# Patient Record
Sex: Male | Born: 1950 | Race: White | Hispanic: No | Marital: Married | Smoking: Former smoker
Health system: Southern US, Community
[De-identification: ages and names within clinical notes are randomized; demographics above are authoritative.]

## PROBLEM LIST (undated history)

## (undated) DIAGNOSIS — K551 Chronic vascular disorders of intestine: Secondary | ICD-10-CM

## (undated) DIAGNOSIS — I2699 Other pulmonary embolism without acute cor pulmonale: Secondary | ICD-10-CM

## (undated) DIAGNOSIS — J309 Allergic rhinitis, unspecified: Secondary | ICD-10-CM

## (undated) DIAGNOSIS — L659 Nonscarring hair loss, unspecified: Secondary | ICD-10-CM

## (undated) DIAGNOSIS — S0590XA Unspecified injury of unspecified eye and orbit, initial encounter: Secondary | ICD-10-CM

## (undated) DIAGNOSIS — F329 Major depressive disorder, single episode, unspecified: Secondary | ICD-10-CM

## (undated) DIAGNOSIS — I2510 Atherosclerotic heart disease of native coronary artery without angina pectoris: Secondary | ICD-10-CM

## (undated) DIAGNOSIS — N529 Male erectile dysfunction, unspecified: Secondary | ICD-10-CM

## (undated) DIAGNOSIS — N400 Enlarged prostate without lower urinary tract symptoms: Secondary | ICD-10-CM

## (undated) DIAGNOSIS — M545 Low back pain, unspecified: Secondary | ICD-10-CM

## (undated) DIAGNOSIS — G8929 Other chronic pain: Secondary | ICD-10-CM

## (undated) DIAGNOSIS — D126 Benign neoplasm of colon, unspecified: Secondary | ICD-10-CM

## (undated) DIAGNOSIS — R7301 Impaired fasting glucose: Secondary | ICD-10-CM

## (undated) DIAGNOSIS — K219 Gastro-esophageal reflux disease without esophagitis: Secondary | ICD-10-CM

## (undated) DIAGNOSIS — F32A Depression, unspecified: Secondary | ICD-10-CM

## (undated) DIAGNOSIS — I10 Essential (primary) hypertension: Secondary | ICD-10-CM

## (undated) DIAGNOSIS — Z7901 Long term (current) use of anticoagulants: Secondary | ICD-10-CM

## (undated) DIAGNOSIS — A6000 Herpesviral infection of urogenital system, unspecified: Secondary | ICD-10-CM

## (undated) HISTORY — PX: COLONOSCOPY: SHX174

## (undated) HISTORY — PX: HEMORRHOID SURGERY: SHX153

---
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
## 2005-10-31 ENCOUNTER — Ambulatory Visit: Payer: Self-pay | Admitting: Unknown Physician Specialty

## 2008-12-21 ENCOUNTER — Ambulatory Visit: Payer: Self-pay | Admitting: Unknown Physician Specialty

## 2013-09-12 ENCOUNTER — Ambulatory Visit: Payer: Self-pay | Admitting: Internal Medicine

## 2013-09-20 ENCOUNTER — Other Ambulatory Visit: Payer: Self-pay

## 2013-09-20 ENCOUNTER — Inpatient Hospital Stay: Payer: Self-pay | Admitting: Internal Medicine

## 2013-09-20 ENCOUNTER — Ambulatory Visit: Payer: Self-pay

## 2013-09-21 LAB — CBC WITH DIFFERENTIAL/PLATELET
Basophil #: 0 10*3/uL (ref 0.0–0.1)
Basophil %: 0.2 %
Eosinophil #: 0.2 10*3/uL (ref 0.0–0.7)
Eosinophil %: 2 %
HCT: 38.2 % — ABNORMAL LOW (ref 40.0–52.0)
HGB: 13.1 g/dL (ref 13.0–18.0)
LYMPHS ABS: 1.7 10*3/uL (ref 1.0–3.6)
Lymphocyte %: 18.7 %
MCH: 31.6 pg (ref 26.0–34.0)
MCHC: 34.3 g/dL (ref 32.0–36.0)
MCV: 92 fL (ref 80–100)
Monocyte #: 1.1 x10 3/mm — ABNORMAL HIGH (ref 0.2–1.0)
Monocyte %: 11.4 %
NEUTROS PCT: 67.7 %
Neutrophil #: 6.3 10*3/uL (ref 1.4–6.5)
Platelet: 187 10*3/uL (ref 150–440)
RBC: 4.15 10*6/uL — AB (ref 4.40–5.90)
RDW: 12 % (ref 11.5–14.5)
WBC: 9.2 10*3/uL (ref 3.8–10.6)

## 2013-09-21 LAB — COMPREHENSIVE METABOLIC PANEL
ALBUMIN: 3.3 g/dL — AB (ref 3.4–5.0)
ALK PHOS: 67 U/L
ALT: 30 U/L (ref 12–78)
ANION GAP: 2 — AB (ref 7–16)
BUN: 11 mg/dL (ref 7–18)
Bilirubin,Total: 0.6 mg/dL (ref 0.2–1.0)
CALCIUM: 8.6 mg/dL (ref 8.5–10.1)
CHLORIDE: 103 mmol/L (ref 98–107)
Co2: 30 mmol/L (ref 21–32)
Creatinine: 0.87 mg/dL (ref 0.60–1.30)
Glucose: 108 mg/dL — ABNORMAL HIGH (ref 65–99)
Osmolality: 270 (ref 275–301)
POTASSIUM: 4 mmol/L (ref 3.5–5.1)
SGOT(AST): 23 U/L (ref 15–37)
Sodium: 135 mmol/L — ABNORMAL LOW (ref 136–145)
TOTAL PROTEIN: 6.9 g/dL (ref 6.4–8.2)

## 2013-09-21 LAB — APTT: Activated PTT: 68.9 secs — ABNORMAL HIGH (ref 23.6–35.9)

## 2013-09-22 LAB — BASIC METABOLIC PANEL
ANION GAP: 6 — AB (ref 7–16)
BUN: 11 mg/dL (ref 7–18)
CALCIUM: 8.9 mg/dL (ref 8.5–10.1)
CHLORIDE: 100 mmol/L (ref 98–107)
Co2: 30 mmol/L (ref 21–32)
Creatinine: 0.92 mg/dL (ref 0.60–1.30)
EGFR (African American): >60
EGFR (Non-African Amer.): >60
Glucose: 99 mg/dL (ref 65–99)
Osmolality: 271 (ref 275–301)
Potassium: 4.3 mmol/L (ref 3.5–5.1)
Sodium: 136 mmol/L (ref 136–145)

## 2013-09-22 LAB — IRON AND TIBC
IRON BIND. CAP.(TOTAL): 266 ug/dL (ref 250–450)
IRON SATURATION: 19 %
IRON: 50 ug/dL — AB (ref 65–175)
Unbound Iron-Bind.Cap.: 216 ug/dL

## 2013-09-22 LAB — CBC WITH DIFFERENTIAL/PLATELET
BASOS ABS: 0 10*3/uL (ref 0.0–0.1)
BASOS PCT: 0.3 %
EOS PCT: 1.5 %
Eosinophil #: 0.1 10*3/uL (ref 0.0–0.7)
HCT: 38.4 % — ABNORMAL LOW (ref 40.0–52.0)
HGB: 13.2 g/dL (ref 13.0–18.0)
LYMPHS PCT: 17.4 %
Lymphocyte #: 1.4 10*3/uL (ref 1.0–3.6)
MCH: 31.4 pg (ref 26.0–34.0)
MCHC: 34.4 g/dL (ref 32.0–36.0)
MCV: 91 fL (ref 80–100)
MONOS PCT: 12.3 %
Monocyte #: 1 x10 3/mm (ref 0.2–1.0)
Neutrophil #: 5.5 10*3/uL (ref 1.4–6.5)
Neutrophil %: 68.5 %
Platelet: 206 10*3/uL (ref 150–440)
RBC: 4.21 10*6/uL — AB (ref 4.40–5.90)
RDW: 12 % (ref 11.5–14.5)
WBC: 8.1 10*3/uL (ref 3.8–10.6)

## 2013-09-22 LAB — APTT
ACTIVATED PTT: 48.4 s — AB (ref 23.6–35.9)
Activated PTT: 99 secs — ABNORMAL HIGH (ref 23.6–35.9)

## 2013-09-22 LAB — PROTIME-INR
INR: 1.9
PROTHROMBIN TIME: 21.5 s — AB (ref 11.5–14.7)

## 2013-09-22 LAB — FERRITIN: Ferritin (ARMC): 311 ng/mL (ref 8–388)

## 2013-09-23 LAB — APTT
ACTIVATED PTT: 125 s — AB (ref 23.6–35.9)
Activated PTT: 78.4 secs — ABNORMAL HIGH (ref 23.6–35.9)

## 2013-09-23 LAB — CBC WITH DIFFERENTIAL/PLATELET
BASOS PCT: 0.4 %
Basophil #: 0 10*3/uL (ref 0.0–0.1)
Eosinophil #: 0.2 10*3/uL (ref 0.0–0.7)
Eosinophil %: 2.8 %
HCT: 37.1 % — ABNORMAL LOW (ref 40.0–52.0)
HGB: 13 g/dL (ref 13.0–18.0)
LYMPHS PCT: 24.8 %
Lymphocyte #: 2.1 10*3/uL (ref 1.0–3.6)
MCH: 31.9 pg (ref 26.0–34.0)
MCHC: 35.1 g/dL (ref 32.0–36.0)
MCV: 91 fL (ref 80–100)
MONO ABS: 1.1 x10 3/mm — AB (ref 0.2–1.0)
MONOS PCT: 12.6 %
Neutrophil #: 5.1 10*3/uL (ref 1.4–6.5)
Neutrophil %: 59.4 %
Platelet: 218 10*3/uL (ref 150–440)
RBC: 4.08 10*6/uL — ABNORMAL LOW (ref 4.40–5.90)
RDW: 11.8 % (ref 11.5–14.5)
WBC: 8.6 10*3/uL (ref 3.8–10.6)

## 2013-09-23 LAB — CEA: CEA: 0.8 ng/mL (ref 0.0–4.7)

## 2013-09-23 LAB — PROTIME-INR
INR: 1.4
PROTHROMBIN TIME: 16.8 s — AB (ref 11.5–14.7)

## 2013-09-23 LAB — PSA: PSA: 0.3 ng/mL (ref 0.0–4.0)

## 2013-10-18 ENCOUNTER — Ambulatory Visit: Payer: Self-pay | Admitting: Internal Medicine

## 2013-11-10 ENCOUNTER — Ambulatory Visit: Payer: Self-pay | Admitting: Internal Medicine

## 2013-11-16 LAB — CANCER ANTIGEN 19-9: CA 19-9: 9 U/mL (ref 0–35)

## 2013-12-10 ENCOUNTER — Ambulatory Visit: Payer: Self-pay | Admitting: Internal Medicine

## 2013-12-27 ENCOUNTER — Ambulatory Visit: Payer: Self-pay | Admitting: Family Medicine

## 2014-01-10 ENCOUNTER — Ambulatory Visit: Payer: Self-pay | Admitting: Internal Medicine

## 2014-01-10 LAB — CBC CANCER CENTER
BASOS ABS: 0.1 x10 3/mm (ref 0.0–0.1)
Basophil %: 1 %
EOS ABS: 0.1 x10 3/mm (ref 0.0–0.7)
EOS PCT: 1.8 %
HCT: 43.3 % (ref 40.0–52.0)
HGB: 14.6 g/dL (ref 13.0–18.0)
LYMPHS ABS: 1.7 x10 3/mm (ref 1.0–3.6)
Lymphocyte %: 24 %
MCH: 30.1 pg (ref 26.0–34.0)
MCHC: 33.8 g/dL (ref 32.0–36.0)
MCV: 89 fL (ref 80–100)
Monocyte #: 0.6 x10 3/mm (ref 0.2–1.0)
Monocyte %: 8.5 %
NEUTROS ABS: 4.6 x10 3/mm (ref 1.4–6.5)
Neutrophil %: 64.7 %
PLATELETS: 238 x10 3/mm (ref 150–440)
RBC: 4.86 10*6/uL (ref 4.40–5.90)
RDW: 12.5 % (ref 11.5–14.5)
WBC: 7.1 x10 3/mm (ref 3.8–10.6)

## 2014-01-10 LAB — IRON AND TIBC
IRON SATURATION: 32 %
Iron Bind.Cap.(Total): 307 ug/dL (ref 250–450)
Iron: 98 ug/dL (ref 65–175)
Unbound Iron-Bind.Cap.: 209 ug/dL

## 2014-01-10 LAB — PROTIME-INR
INR: 1.8
Prothrombin Time: 20.4 secs — ABNORMAL HIGH (ref 11.5–14.7)

## 2014-01-10 LAB — FERRITIN: FERRITIN (ARMC): 163 ng/mL (ref 8–388)

## 2014-01-14 LAB — PROTIME-INR
INR: 1.9
PROTHROMBIN TIME: 21.1 s — AB (ref 11.5–14.7)

## 2014-01-19 LAB — PROTIME-INR
INR: 2
Prothrombin Time: 22.1 secs — ABNORMAL HIGH (ref 11.5–14.7)

## 2014-01-24 LAB — PROTIME-INR
INR: 1.8
PROTHROMBIN TIME: 20.4 s — AB (ref 11.5–14.7)

## 2014-01-28 LAB — PROTIME-INR
INR: 2.2
PROTHROMBIN TIME: 23.8 s — AB (ref 11.5–14.7)

## 2014-02-09 ENCOUNTER — Ambulatory Visit: Payer: Self-pay | Admitting: Internal Medicine

## 2014-02-09 LAB — PROTIME-INR
INR: 2.9
Prothrombin Time: 29.2 secs — ABNORMAL HIGH (ref 11.5–14.7)

## 2014-02-14 LAB — PROTIME-INR
INR: 2
PROTHROMBIN TIME: 22 s — AB (ref 11.5–14.7)

## 2014-02-14 LAB — D-DIMER(ARMC): D-Dimer: 295 ng/ml

## 2014-02-28 LAB — PROTIME-INR
INR: 2.5
PROTHROMBIN TIME: 26.6 s — AB (ref 11.5–14.7)

## 2014-03-07 ENCOUNTER — Ambulatory Visit: Payer: Self-pay | Admitting: Unknown Physician Specialty

## 2014-03-09 LAB — PATHOLOGY REPORT

## 2014-03-12 ENCOUNTER — Ambulatory Visit: Payer: Self-pay | Admitting: Internal Medicine

## 2014-03-14 LAB — CBC CANCER CENTER
Basophil #: 0 x10 3/mm (ref 0.0–0.1)
Basophil %: 0.5 %
EOS ABS: 0.2 x10 3/mm (ref 0.0–0.7)
Eosinophil %: 2.6 %
HCT: 41.8 % (ref 40.0–52.0)
HGB: 14 g/dL (ref 13.0–18.0)
LYMPHS ABS: 1.9 x10 3/mm (ref 1.0–3.6)
LYMPHS PCT: 29.2 %
MCH: 30.3 pg (ref 26.0–34.0)
MCHC: 33.6 g/dL (ref 32.0–36.0)
MCV: 90 fL (ref 80–100)
MONO ABS: 0.6 x10 3/mm (ref 0.2–1.0)
Monocyte %: 9.4 %
Neutrophil #: 3.7 x10 3/mm (ref 1.4–6.5)
Neutrophil %: 58.3 %
Platelet: 243 x10 3/mm (ref 150–440)
RBC: 4.63 10*6/uL (ref 4.40–5.90)
RDW: 12.7 % (ref 11.5–14.5)
WBC: 6.4 x10 3/mm (ref 3.8–10.6)

## 2014-03-14 LAB — PROTIME-INR
INR: 1.5
Prothrombin Time: 18.2 secs — ABNORMAL HIGH (ref 11.5–14.7)

## 2014-03-18 LAB — PROTIME-INR
INR: 2.1
PROTHROMBIN TIME: 22.8 s — AB (ref 11.5–14.7)

## 2014-04-01 LAB — PROTIME-INR
INR: 2.6
Prothrombin Time: 26.8 secs — ABNORMAL HIGH (ref 11.5–14.7)

## 2014-04-12 ENCOUNTER — Ambulatory Visit: Payer: Self-pay | Admitting: Internal Medicine

## 2014-12-03 NOTE — Consult Note (Signed)
PATIENT NAME:  Jacob Davenport, DOWNARD MR#:  [id number] DATE OF BIRTH:  May 28, 1951  DATE OF CONSULTATION:  09/21/2013  REFERRING PHYSICIAN:   CONSULTING PHYSICIAN:  Simonne Come. Kyle Stansell, MD  HISTORY OF PRESENT ILLNESS:  Mr. Jacob Davenport is a 64 year old patient who was admitted on February 9th with abdominal distention, belching, bilateral PEs, chest pain with wheezing. He had CT of the chest that showed bilateral pulmonary emboli. He was started on IV heparin initially. His d-dimer was slightly elevated. 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 I saw and evaluated the patient in consultation on February 10th.  I saw him  at that time and on the admission history and physical he had PE but no lower extremity clot. He had some developing pulmonary infarct on CT. His history revealed no immobilization or injury or other precipitating event. He has a negative personal history for cancer. He has a positive personal history of polyps but colonoscopy was 4-1/2 years ago and repeat was due later this year. His family history is positive for colon cancer. Baseline chemistries were normal with a borderline anemia. There was no tumor seen on the chest or abdominopelvic CT scan.   PAST HISTORY: Positive for depression, chronic back pain, hyperlipidemia, alopecia and GERD as well as hemorrhoidectomy.   MEDICATIONS AT THE TIME OF ADMISSION: Wellbutrin 300 daily, simvastatin 20 mg daily, finasteride 5 mg daily, Lexapro 20 mg daily, Toprol 25 mg daily, amlodipine 5 mg daily, multivitamin daily, ranitidine 75 mg p.r.n.  SOCIAL HISTORY:  He had no smoking. No alcohol history.   FAMILY HISTORY: Negative and no clotting or malignancies.   REVIEW OF SYSTEMS: Additional review of systems was positive for the wheezing recently and chest pain. No shortness of breath, pleuritic quality of the chest pain. Also some abdominal discomfort and  bloating and belching. He otherwise had not had and was not having any headache or visual disturbances. No dizziness. No fever, chills or sweats. He had a cough that was nonproductive. He did not have abdominal pain when I saw him. He had had some loose stools. He was short of breath on exertion, did not have any palpitations. He did not have any edema, rash, bruising, pruritus, hot or cold intolerance, recent weight loss or weight gain.   PHYSICAL EXAMINATION: GENERAL: He was alert and cooperative, in no acute distress.  EYES: Sclerae clear.  MOUTH: No thrush.  HEART: Regular.  ABDOMEN: Benign. There was slight distention. His abdomen was soft, not tender. No palpable mass or organomegaly.  LYMPH NODES: Negative in the neck, supraclavicular, submandibular, axilla.  EXTREMITIES: Negative for edema.  SKIN: Negative for rash.  NEUROLOGIC: Grossly nonfocal.  PSYCH:  Normal affect, mood was normal.  LUNGS: He had no wheezing.   LABORATORY, DIAGNOSTIC AND RADIOLOGICAL DATA: On February 10th, the bilirubin and liver functions were normal. His creatinine was 0.7. Available later was a borderline iron saturation of 19%. Ferritin was 311, that was seen from February 11th. On admission, his white count was 9.2, hemoglobin 13.1, hematocrit 38, platelets 187. Doppler of the lower extremities was negative except for a Baker's cyst of the left popliteal fossa. CT of the abdomen was as above, some mild hemorrhage in the left lower lobe of the lung, no abdominal or pelvic pathology. There was some narrowing of the SMA. Visualized skeleton as well as the liver, gallbladder, pancreas, spleen and adrenals were unremarkable. Very  small lesions, likely cysts, numerous in the kidneys and 1 clearly simple cyst in the upper pole of the right kidney. CT of the chest again as noted revealed bilateral pulmonary emboli and a left pleural effusion.   IMPRESSION: The patient is with an unprecipitated clot with no malignancy  history. He had a borderline anemia that turned out to be mildly iron deficient. 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 The PSA was 0.3, his CEA was 0.8.   PLAN:  The plan was to treat the patient with initially heparin and then he was changed to Xarelto. Follow up in the [place] to determine if the lupus inhibitor is transient or persistent. To  check an ANA and look at other coag studies in determining the length of anticoagulation. Vascular surgery has already seen and given clearance for the findings on the SMA. With the patient's history of polyps, a colonoscopy due later this year and the finding of borderline deficiency, will go on and recommend that after he has been on anticoagulation indefinite period but approximately 8 weeks, could then be okay to do briefly come off and on again for the purpose of going ahead with a followup colonoscopy. Follow up in the [place] after discharge.     ____________________________ Simonne Come. Inez Pilgrim, MD rgg:cs D: 10/24/2013 17:05:01 ET T: 10/24/2013 21:05:01 ET JOB#: [id number]  cc: Simonne Come. Inez Pilgrim, MD, <Dictator> Dallas Schimke MD ELECTRONICALLY SIGNED 10/26/2013 15:01

## 2014-12-03 NOTE — Consult Note (Signed)
Brief Consult Note: Diagnosis: PE.   Patient was seen by consultant.   Orders entered.   Comments: SEE DICTATED NOTE TO FOLLOW    PE, NO LOWER EXT CLOT, SOME PULM INFART SEEN DEVELOPING ON CT. NO PRIOR HX OR FH OF CLOT. NO IMMOBILIZATION OR INJURY OR PRECIPITATING EVENT.  POS HX POLYPS LAST COLONOSCOPY 4.5 YRS AGO. FH POS COLON CANCER.  NORMAL CHEMISTRIES, BORDERLINE ANEMIA.  NO TUMOR ON CHEST, ABDO, PLVIS SCAN.  A NARROWING OFSMA SEEN ON CT,  HAVE DISCUSSED WITH AND CONSULTED VASCULAR SURGERY.  WILL KEEP ON HEPARIN NO [place] FOR NOW. WILL CHECK  IRON STUDIES, PSA, CEA. CHECK LUPUS INHIBITOR, IF NEG , CHCK PHOSPHOLIPID ANTIBODIES AND FOR INHERITED DISORDERS ALTHOUGH LOW YIELD. IF NO REVRSIBLE INHIBITOR, THEN LOOKING AT INDEFINATE ANTICOAGULATION BASED ON RISK FACTORS MALE AND AGE.  Electronic Signatures: Dallas Schimke (MD)  (Signed 10-Feb-15 19:29)  Authored: Brief Consult Note   Last Updated: 10-Feb-15 19:29 by Dallas Schimke (MD)

## 2014-12-03 NOTE — Discharge Summary (Signed)
PATIENT NAME:  Jacob Davenport, OUTMAN MR#:  [id number] DATE OF BIRTH:  1951/08/02  DATE OF ADMISSION:  09/20/2013 DATE OF DISCHARGE:  09/23/2013  DISCHARGE DIAGNOSIS: Bilateral pulmonary emboli.  DISCHARGE MEDICATIONS: 1.  Wellbutrin 300 mg 1 tab p.o. daily.  2.  Simvastatin 20 mg p.o. at bedtime.  3.  Finasteride 5 mg p.o. daily.  4.  Multivitamin p.o. daily.  5.  Ranitidine 75 mg p.o. daily.  6.  Amlodipine 5 mg p.o. daily.  7.  Xarelto 15 mg p.o. b.i.d. x 21 days.   CONSULTANTS: Vascular surgery and hematology/oncology.   LABORATORY AND DIAGNOSTICS: CT of the chest did show bilateral PEs. CT of the abdomen showed superior mesenteric artery with stenosis. Doppler ultrasound was negative for DVTs prior to discharge.   Sodium 136, potassium 4.3, creatinine 0.92. White blood cell count 8.1, hemoglobin 13.2, platelets 206.   BRIEF HOSPITAL COURSE: Bilateral Pes: The patient initially came in with complaints of pleuritic chest pain. A CT of the chest did show bilateral PEs. He was started on heparin drip. No source of the PEs were found. Hypercoagulable work-up performed by Dr. Inez Pilgrim. Plan to continue with Xarelto indefinitely at this time. Other chronic medical issues are stable. He will follow up with Dr. Netty Starring in 1 week for those and follow up with Dr. Inez Pilgrim for the hypercoagulable workup and PEs.  ____________________________ Dion Body, MD kl:sb D: 09/23/2013 11:55:25 ET T: 09/23/2013 12:16:56 ET JOB#: [id number]  cc: Dion Body, MD, <Dictator> Dion Body MD ELECTRONICALLY SIGNED 10/01/2013 10:51

## 2014-12-03 NOTE — H&P (Signed)
PATIENT NAME:  Jacob Davenport, Jacob Davenport MR#:  [id number] DATE OF BIRTH:  Oct 30, 1950  DATE OF ADMISSION:  09/20/2013  HISTORY OF PRESENT ILLNESS: The patient is a 64 year old male presents with abdominal distention, burping, belching and bilateral PEs. He was in his normal state of health until yesterday when he was walking around the block and began having some pleuritic chest pain with wheezing. He has actually had some wheezing in the last month or so, but it got a lot worse. No productive cough or color to it. No chest tightness, just the pleuritic component to it. Bowels have been moving fine. No fever or chills. He has been overall not really active, but has been getting around. No recent surgeries. No family history of bilateral pulmonary emboli. He will be admitted for further evaluation and treatment.   PAST MEDICAL HISTORY AND MEDICAL ILLNESSES:  History of depression, chronic low back pain, hyperlipidemia, alopecia, GERD.   PAST SURGICAL HISTORY:  Hemorrhoidectomy.   MEDICATIONS: Wellbutrin XL 300 mg daily, simvastatin 20 mg at bedtime, finasteride 5 mg daily, Lexapro 20 mg daily, Toprol-XL 25 mg daily, amlodipine 5 mg daily, multivitamin daily, ranitidine 75 mg daily p.r.n.   SOCIAL HISTORY:  He is an Recruitment consultant. Married. No smoking or alcohol.   FAMILY HISTORY: Noncontributory. No thromboembolic family history.   REVIEW OF SYSTEMS: As above, otherwise negative.   PHYSICAL EXAMINATION: VITAL SIGNS: Blood pressure 130/80, pulse 85, regular; pulse oximetry 96% on room air, afebrile. Weight 194.  HEENT: Pupils are reactive.  Oropharynx benign.     LUNGS: Clear.  HEART: Regular rhythm. No audible murmur.  ABDOMEN: Good bowel sounds. Soft, mild distention. No rebound.  EXTREMITIES: Trace edema. Good peripheral pulses.  NEUROLOGIC: Grossly nonfocal.   IMAGING STUDIES:  CT angiography of the chest showed no pneumothorax. Bilateral pulmonary emboli noted in the lower  branches, as well as the right upper lobe. Some mild right heart strain with minimal left pleural effusion.   ASSESSMENT AND PLAN: 1.  Bilateral pulmonary emboli - no clear risk factors. We will check factor V.  2.  With abdominal distention, plan abdominopelvic CT scan to evaluate that, as that could be the source of the clot. No history of aneurysm.  3. Nonacute abdomen: We will treat it with IV Protonix with burping.  4. Ultimately may need ultrasound of his legs. Initiate IV heparin nomogram, ultimately converting over to p.o. Xarelto.   Labs were normal in the office. D-dimer slightly elevated at 0.7. No sign for acute coronary syndrome. Overall prognosis is guarded.     ____________________________ Rusty Aus, MD mfm:dmm D: 09/20/2013 20:16:00 ET T: 09/20/2013 20:29:47 ET JOB#: [id number]  cc: Dion Body, MD Rusty Aus, MD, <Dictator>   Rusty Aus MD ELECTRONICALLY SIGNED 09/21/2013 12:49

## 2014-12-03 NOTE — H&P (Signed)
PATIENT NAME:  Jacob Davenport, Jacob Davenport MR#:  [id number] DATE OF BIRTH:  [birth year]  DATE OF ADMISSION:  09/20/2013  A 64 year old male who presents with bilateral pulmonary emboli. He was in his usual state of health yesterday until he started ambulating, then he became short of breath with progressive pleuritic chest pain. He came to the outpatient clinic with this pleuritic chest pain.   LABORATORIES: Normal. CT scan showed bilateral PEs concomitant with this was abdominal distention and swelling with a lot of burping. No nausea, vomiting, no fever or chills. He has no family history of thromboembolic disease. He will be admitted for further evaluation and treatment.   PAST MEDICAL HISTORY AND MEDICAL ILLNESSES: Hypertension, anxiety/depression and GERD.   PAST SURGICAL HISTORY: None.   ALLERGIES: None.   MEDICATIONS: Lexapro 20 mg daily, Wellbutrin XL 3300 mg daily, Norvasc 5 mg daily, ranitidine 75 mg daily p.r.n.   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 No smoking or alcohol.   FAMILY HISTORY: Noncontributory. No embolic disease.   REVIEW OF SYSTEMS: Otherwise negative.   PHYSICAL EXAMINATION: VITAL SIGNS: Blood pressure 105/71, pulse oximetry 93% on room air, afebrile.  HEENT: Pupils are equal and reactive.  NECK: No thyromegaly or bruits.  LUNGS: Clear to auscultation and percussion.  HEART: Regular rhythm.  ABDOMEN: Mildly distended, soft, minimally tender in the epigastrium.   EXTREMITIES: 1+ edema. Good peripheral pulses.   ASSESSMENT AND PLAN: 1. Bilateral pulmonary emboli - etiology uncertain. Factor V Leiden pending. An abdominopelvic CT scan pending to look for clot burden. May need to look in his legs if the CT negative. No hemodynamic instability. Currently on IV heparin, can be switched over to Xarelto with time.  2. Hypertension - hold Toprol-XL since his blood pressure and to be sure hemodynamically he is stable. Overall prognosis is good.     ____________________________ Rusty Aus, MD mfm:sg D: 09/21/2013 08:24:37 ET T: 09/21/2013 08:43:26 ET JOB#: [id number]  cc: Rusty Aus, MD, <Dictator> MARK Roselee Culver MD ELECTRONICALLY SIGNED 09/21/2013 12:50

## 2014-12-03 NOTE — Consult Note (Signed)
CHIEF COMPLAINT and HISTORY:  Subjective/Chief Complaint shortness or breath, PE   History of Present Illness Patient admitted Monday for SOB and lower chest pain.  Had acute pain Sunday and has noticed some SOB and lethargy for several months.  Was found to have bilateral PE and started on Heparin.  This has significantly reduced his SOB and resolved his pleuritic lower chest pain.  He denies any history of post-prandial abdominal pain, food fear, or weight loss.  He does report having urgency with BM intermittently about an hour after meals.  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   Past History HTN Hemorrhoids depression   ALLERGIES:  Allergies:  No Known Allergies:   HOME MEDICATIONS:  Home Medications: Medication Instructions Status  Wellbutrin XL 300 mg/24 hours oral tablet, extended release 1 tab(s) orally every 24 hours Active  simvastatin 20 mg oral tablet 1 tab(s) orally once a day (at bedtime) Active  finasteride 5 mg oral tablet 1 tab(s) orally once a day Active  Lexapro 20 mg oral tablet 1 tab(s) orally once a day Active  Toprol-XL 25 mg oral tablet, extended release 1 tab(s) orally once a day Active  multivitamin   once a day Active  ranitidine 75 mg oral tablet  orally once a day Active  amLODIPine 5 mg oral tablet 1 tab(s) orally once a day Active   Family and Social History:  Family History Non-Contributory   Social History negative tobacco, negative ETOH   Place of Living Home   Review of Systems:  Fever/Chills No   Cough Yes   Sputum No   Abdominal Pain No   Diarrhea Yes   Constipation No   Nausea/Vomiting No   SOB/DOE Yes   Chest Pain Yes   Telemetry Reviewed NSR   Dysuria No   Tolerating PT Yes   Tolerating Diet Yes   Physical Exam:  GEN well developed, well nourished   HEENT pink conjunctivae, moist oral mucosa   NECK No masses  trachea midline   RESP normal resp effort  no use  of accessory muscles   CARD regular rate  no JVD   VASCULAR ACCESS none   ABD denies tenderness  normal BS   GU no superpubic tenderness   LYMPH negative neck, negative axillae   EXTR negative cyanosis/clubbing, negative edema   SKIN normal to palpation, skin turgor good   NEURO cranial nerves intact, motor/sensory function intact   PSYCH alert, A+O to time, place, person   LABS:  Laboratory Results: LabObservation:    10-Feb-15 15:25, [place] Color Flow Doppler Low Extrem Bilat (Legs)  OBSERVATION   Reason for Test Positive Known Pulmonary Embolus  Hepatic:    10-Feb-15 04:02, Comprehensive Metabolic Panel  Bilirubin, Total 0.6  Alkaline Phosphatase 67  45-117  NOTE: New Reference Range  07/02/13  SGPT (ALT) 30  SGOT (AST) 23  Total Protein, Serum 6.9  Albumin, Serum 3.3  Routine Chem:  Glucose, Serum 108  BUN 11  Creatinine (comp) 0.87  Sodium, Serum 135  Potassium, Serum 4.0  Chloride, Serum 103  CO2, Serum 30  Calcium (Total), Serum 8.6  Osmolality (calc) 270  eGFR (African American) >60  eGFR (Non-African American) >60  eGFR values <12m/min/1.73 m2 may be an indication of chronic  kidney disease (CKD).  Calculated eGFR is useful in patients with stable renal function.  The eGFR calculation will not be reliable in acutely ill patients  when serum  creatinine is changing rapidly. It is not useful in   patients on dialysis. The eGFR calculation may not be applicable  to patients at the low and high extremes of body sizes, pregnant  women, and vegetarians.  Anion Gap 2    11-Feb-15 00:92, Basic Metabolic Panel (w/Total Calcium)  Glucose, Serum 99  BUN 11  Creatinine (comp) 0.92  Sodium, Serum 136  Potassium, Serum 4.3  Chloride, Serum 100  CO2, Serum 30  Calcium (Total), Serum 8.9  Anion Gap 6  Osmolality (calc) 271  eGFR (African American) >60  eGFR (Non-African American) >60  eGFR values <33m/min/1.73 m2 may be an indication of chronic  kidney  disease (CKD).  Calculated eGFR is useful in patients with stable renal function.  The eGFR calculation will not be reliable in acutely ill patients  when serum creatinine is changing rapidly. It is not useful in   patients on dialysis. The eGFR calculation may not be applicable  to patients at the low and high extremes of body sizes, pregnant  women, and vegetarians.    11-Feb-15 04:00, Ferritin ([facility]  Ferritin ([facility] 311  Result(s) reported on 22 Sep 2013 at 04:30AM.    11-Feb-15 04:00, Iron and IBC ([facility]  Iron Binding Capacity (TIBC) 266  Unbound Iron Binding Capacity 216  Iron, Serum 50  Iron Saturation 19  Result(s) reported on 22 Sep 2013 at 05:55AM.  Routine Coag:    10-Feb-15 04:02, Activated PTT  Activated PTT (APTT) 68.9  A HCT value >55% may artifactually increase the APTT. In one study,  the increase was an average of 19%.  Reference: "Effect on Routine and Special Coagulation Testing Values  of Citrate Anticoagulant Adjustment in Patients with High HCT Values."  American Journal of Clinical Pathology 2006;126:400-405.    11-Feb-15 04:00, Activated PTT  Activated PTT (APTT) 48.4  A HCT value >55% may artifactually increase the APTT. In one study,  the increase was an average of 19%.  Reference: "Effect on Routine and Special Coagulation Testing Values  of Citrate Anticoagulant Adjustment in Patients with High HCT Values."  American Journal of Clinical Pathology 2006;126:400-405.    11-Feb-15 04:00, Prothrombin Time  Prothrombin 21.5  INR 1.9  INR reference interval applies to patients on anticoagulant therapy.  A single INR therapeutic range for coumarins is not optimal for all  indications; however, the suggested range for most indications is  2.0 - 3.0.  Exceptions to the INR Reference Range may include: Prosthetic heart  valves, acute myocardial infarction, prevention of myocardial  infarction, and combinations of aspirin and anticoagulant. The need  for a  higher or lower target INR must be assessed individually.  Reference: The Pharmacology and Management of the Vitamin K   antagonists: the seventh ACCP Conference on Antithrombotic and  Thrombolytic Therapy. CZRAQT.6226Sept:126 (3suppl): [id number]  A HCT value >55% may artifactually increase the PT.  In one study,   the increase was an average of 25%.  Reference:  "Effect on Routine and Special Coagulation Testing Values  of Citrate Anticoagulant Adjustment in Patients with High HCT Values."  American Journal of Clinical Pathology 2006;126:400-405.  Routine Hem:    10-Feb-15 04:02, CBC Profile  WBC (CBC) 9.2  RBC (CBC) 4.15  Hemoglobin (CBC) 13.1  Hematocrit (CBC) 38.2  Platelet Count (CBC) 187  MCV 92  MCH 31.6  MCHC 34.3  RDW 12.0  Neutrophil % 67.7  Lymphocyte % 18.7  Monocyte % 11.4  Eosinophil % 2.0  Basophil % 0.2  Neutrophil # 6.3  Lymphocyte # 1.7  Monocyte # 1.1  Eosinophil # 0.2  Basophil # 0.0  Result(s) reported on 21 Sep 2013 at 04:48AM.    11-Feb-15 04:00, CBC Profile  WBC (CBC) 8.1  RBC (CBC) 4.21  Hemoglobin (CBC) 13.2  Hematocrit (CBC) 38.4  Platelet Count (CBC) 206  MCV 91  MCH 31.4  MCHC 34.4  RDW 12.0  Neutrophil % 68.5  Lymphocyte % 17.4  Monocyte % 12.3  Eosinophil % 1.5  Basophil % 0.3  Neutrophil # 5.5  Lymphocyte # 1.4  Monocyte # 1.0  Eosinophil # 0.1  Basophil # 0.0  Result(s) reported on 22 Sep 2013 at 04:36AM.   RADIOLOGY:  Radiology Results: [place]:    10-Feb-15 15:25, [place] Color Flow Doppler Low Extrem Bilat (Legs)  [place] Color Flow Doppler Low Extrem Bilat (Legs)  REASON FOR EXAM:    Positive Known Pulmonary Embolus  COMMENTS:       PROCEDURE: [place]  - US DOPPLER LOW EXTR BILATERAL  - Sep 21 2013  3:25PM     CLINICAL DATA:  Known pulmonary embolus.    EXAM:  BILATERAL LOWER EXTREMITY VENOUS DOPPLER ULTRASOUND    TECHNIQUE:  Gray-scale sonography with graded compression, as well as color  Doppler and duplex ultrasound, were  performed to evaluate the deep  venous system from the level of the common femoral vein through the  popliteal and proximal calf veins. Spectral Doppler was utilized to  evaluate flow at rest and with distal augmentation maneuvers.    COMPARISON:  None.    FINDINGS:  Thrombus within deep veins:  None visualized.    Compressibility of deep veins:  Normal.    Duplex waveform respiratory phasicity:  Normal.    Duplex waveform response to augmentation:  Normal.    Venous reflux:  None visualized.  Other findings: 2.8 cm left popliteal fossa cyst consistent with a  Baker's cyst.     IMPRESSION:  No evidence of a deep venous thrombosis ofeither lower extremity.      Electronically Signed    By: Lajean Manes M.D.    On: 09/21/2013 15:27         Verified By: Lasandra Beech, M.D.,  [place]:    09-Feb-15 18:58, [facility] Chest with for PE  PACS Image    10-Feb-15 11:35, CT Abdomen and Pelvis With Contrast  PACS Image    10-Feb-15 15:25, [place] Color Flow Doppler Low Extrem Bilat (Legs)  PACS Image  CT:    10-Feb-15 11:35, CT Abdomen and Pelvis With Contrast  CT Abdomen and Pelvis With Contrast  REASON FOR EXAM:    (1) distention, pain, pulm embolus; (2) same as   abdomen  COMMENTS:       PROCEDURE: CT  - CT ABDOMEN / PELVIS  W  - Sep 21 2013 11:35AM     CLINICAL DATA:  Abdominal distention. Swelling. Abdominal pain.  Constipation.    EXAM:  CT ABDOMEN AND PELVIS WITH CONTRAST    TECHNIQUE:  Multidetector CT imaging of the abdomen and pelvis was performed  using the standard protocol following bolus administration of  intravenous contrast.    CONTRAST:  125 mm of Isovue 370.    COMPARISON:  No priors.    FINDINGS:  Lung Bases: There is a combination of ground-glass attenuation  airspace disease and subsegmental atelectasis in the inferior aspect  of the left lower lobe, presumably related to known pulmonary  embolism with pulmonary infarction (ground-glass  attenuation likely  reflects some hemorrhage). Small left pleural effusion is  low-attenuation, partially subpulmonic and partially layering.    Abdomen/Pelvis: The appearance of the liver, gallbladder, pancreas,  spleen and bilateral adrenal glands is unremarkable. Numerous sub cm  low-attenuation lesions are noted in the kidneys bilaterally, too  small to definitively characterize (statistically favored to  represent small cysts). 1 cm simple cyst in the upper pole of the  right kidney.    No significant volume of ascites. No pneumoperitoneum. No pathologic  distention of small bowel. Normal appendix. No definite  lymphadenopathy identified within the abdomen or pelvis. Mild  atherosclerosis throughout the abdominal and pelvic vasculature,  including a large amount of eccentric plaque at the ostium of the  superior mesenteric artery which narrows the lumen to approximately  5 x 3 mm (vessel distal to the stenosis measures 8 mm in diameter).  Celiac axis and inferior mesenteric arteries are widely patent at  this time. Prostate gland and urinary bladder are unremarkable in  appearance.    Musculoskeletal: There are no aggressive appearing lytic or blastic  lesions noted in the visualized portions of the skeleton.     IMPRESSION:  1. No definite source for abdominal pain is identified on today's  study.  2. However, while there is only mild atherosclerosis throughout the  abdominal and pelvic vasculature in general, at the ostium of the  superior mesenteric artery there is a significant stenosis related  to a large burden of eccentric atheromatous plaque. The celiac axis  and inferior mesenteric arteries are both widely patent at this  time.  3. Normal appendix.  4. Sequela of known pulmonary embolism including probable area of  mild hemorrhage in the left lower lobe with some associated  subsegmental atelectasis and small reactive left pleural effusion.  5. Additional  incidental findings, as above.      Electronically Signed    By: Vinnie Langton M.D.    On: 09/21/2013 14:03         Verified By: Etheleen Mayhew, M.D.,   ASSESSMENT AND PLAN:  Assessment/Admission Diagnosis PE No residual LE DVT SMA atherosclerosis with stenosis.  IMA and celiac patent   Plan Would agree with anticoagulation for his PE for 6-12 months, or longer if any hypercoagulable state is identified. No role for IVC filter with negative LE venous duplex at this time SMA stenosis is likely asymptomatic and if he has any symptoms it appears to be urgency of bowels after meals, and this is not that troubling.  he has no post-prandial abdominal pain, food fear, or weight loss.  With Celiac and IMA patent and no major symptoms, would not recommend any intervention at this time.  Will plan outpatient follow up with duplex of the mesenteric vessels serially.   He and his wife  voice their understanding.     level 4   Electronic Signatures: Algernon Huxley (MD)  (Signed 11-Feb-15 14:28)  Authored: Chief Complaint and History, ALLERGIES, HOME MEDICATIONS, Family and Social History, Review of Systems, Physical Exam, LABS, RADIOLOGY, Assessment and Plan   Last Updated: 11-Feb-15 14:28 by Algernon Huxley (MD)

## 2014-12-11 IMAGING — CT CT ANGIO CHEST
2 of 6 series · 16 of 36 positions shown · IV contrast (APPLIED)
Comparison: 09/20/2013

CLINICAL DATA: History of pulmonary emboli, right heart strain

EXAM:
CT ANGIOGRAPHY CHEST WITH CONTRAST
TECHNIQUE: Multidetector CT imaging of the chest was performed using the
standard protocol during bolus administration of intravenous
contrast. Multiplanar CT image reconstructions and MIPs were
obtained to evaluate the vascular anatomy.
CONTRAST:  100 mL Isovue 370

[Series 4: pe thins 1.5 · axial · 0.71mm/px · z∈[-607,-359]mm · 15 of 228 slices shown]
[im 15/228  lung]
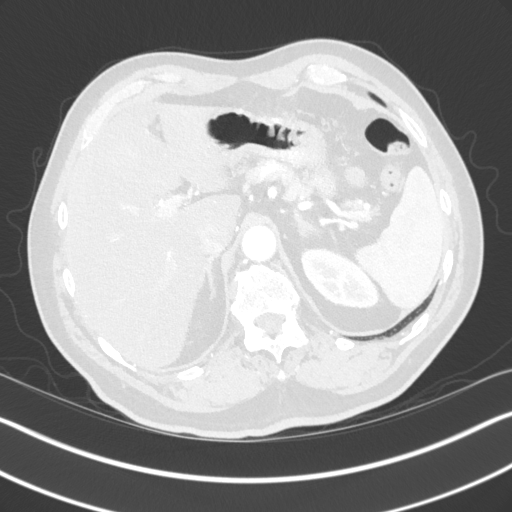
[im 29/228  mediastinal]
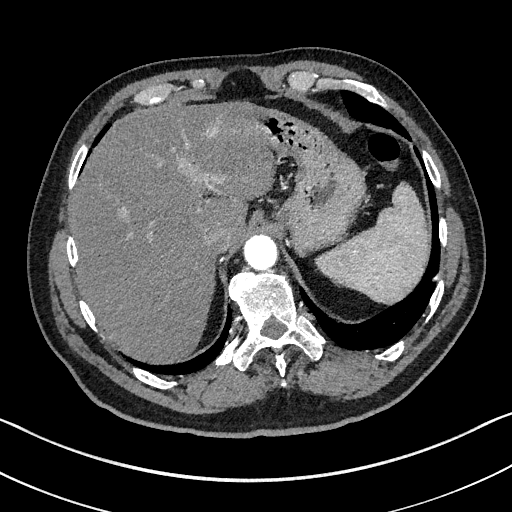
[im 43/228  lung]
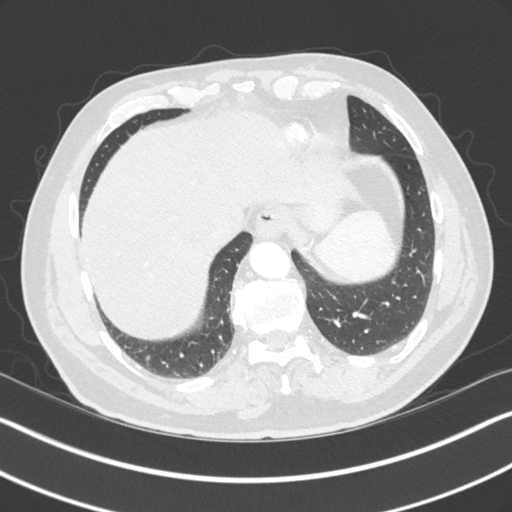
[im 57/228  mediastinal]
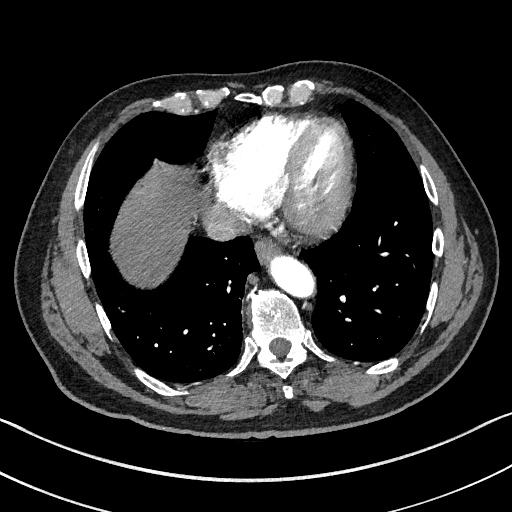
[im 71/228  lung]
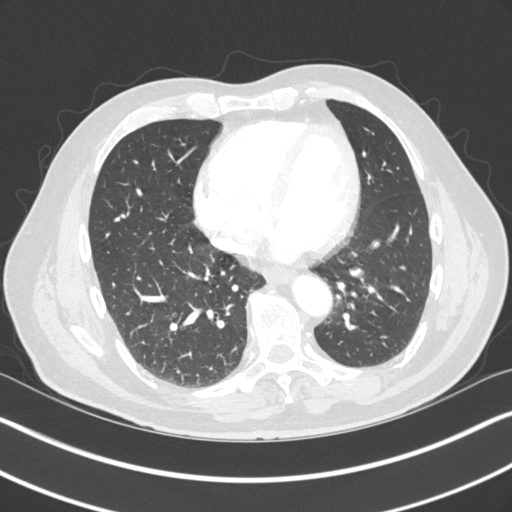
[im 86/228  mediastinal]
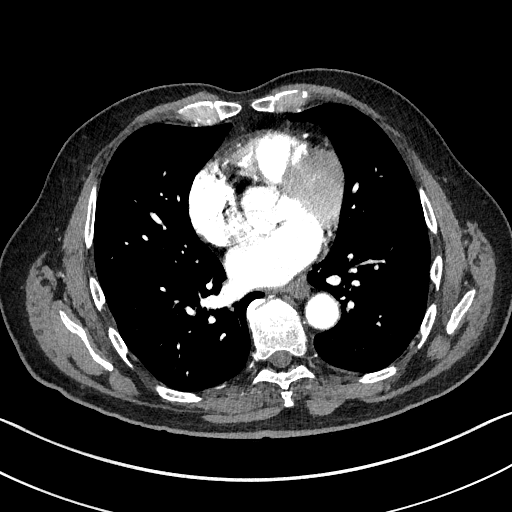
[im 100/228  lung]
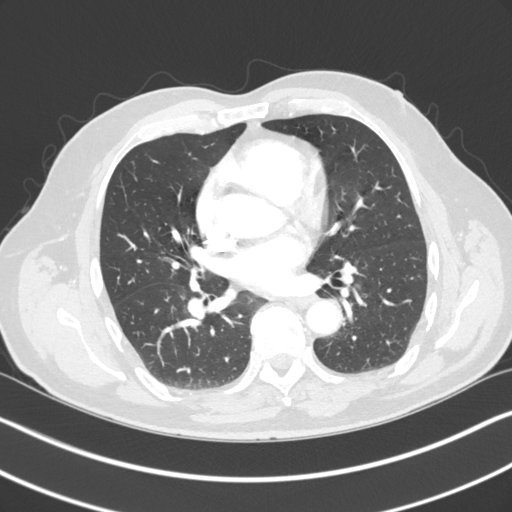
[im 114/228  mediastinal]
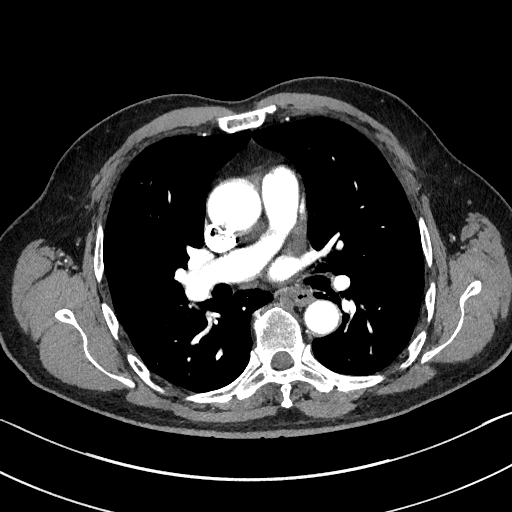
[im 128/228  lung]
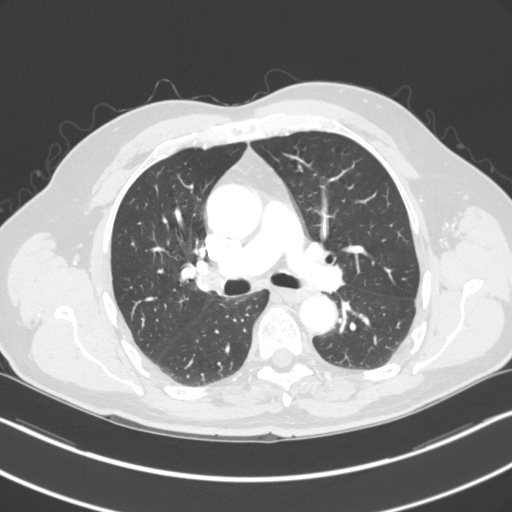
[im 142/228  mediastinal]
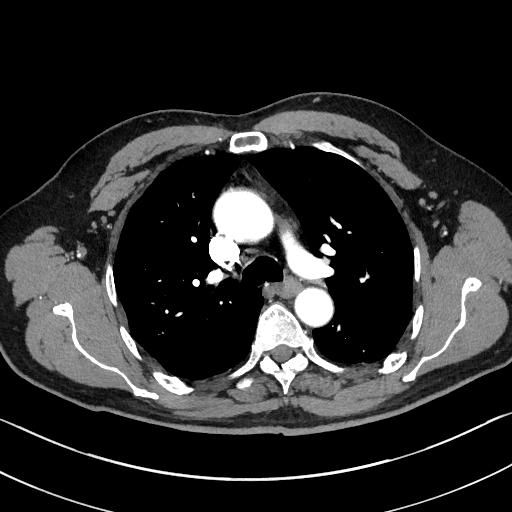
[im 157/228  lung]
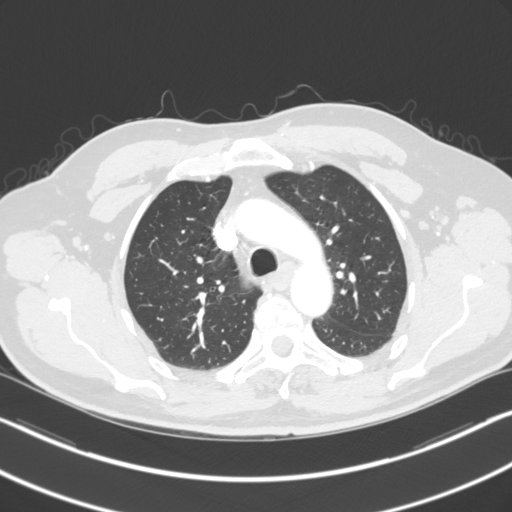
[im 171/228  mediastinal]
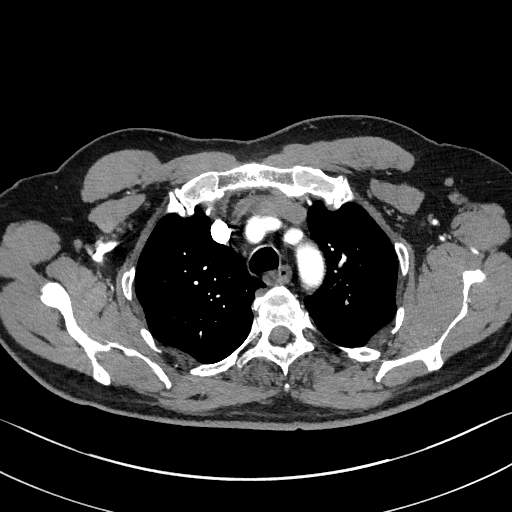
[im 185/228  lung]
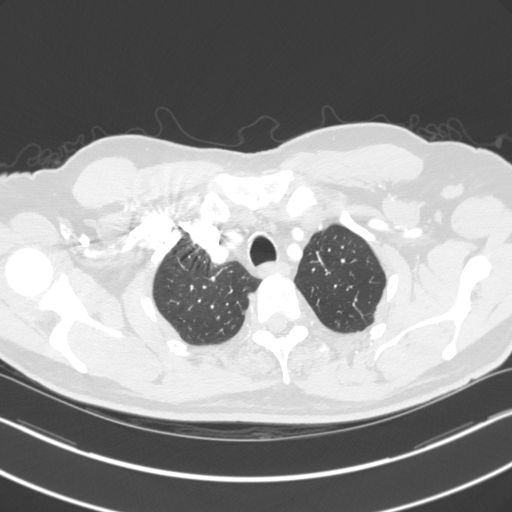
[im 199/228  mediastinal]
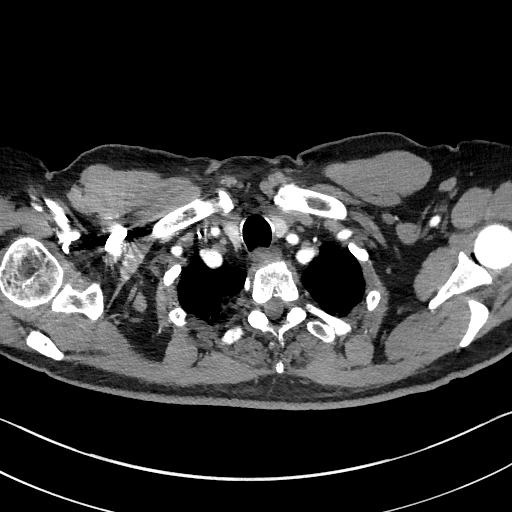
[im 213/228  lung]
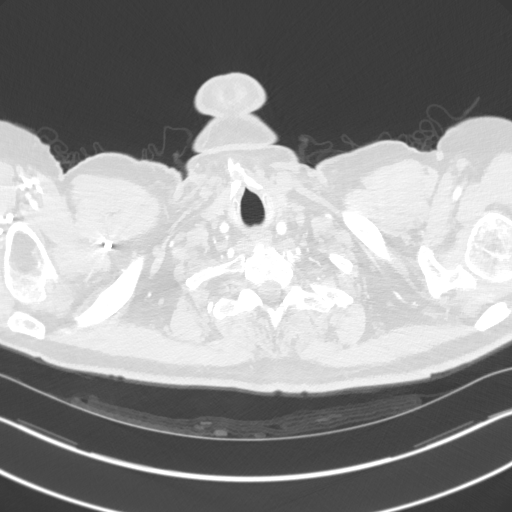

[Series 7: cor pe 2.0 mpr · coronal · 0.58mm/px · 1 of 143 slices shown]
[im 72/143  mediastinal]
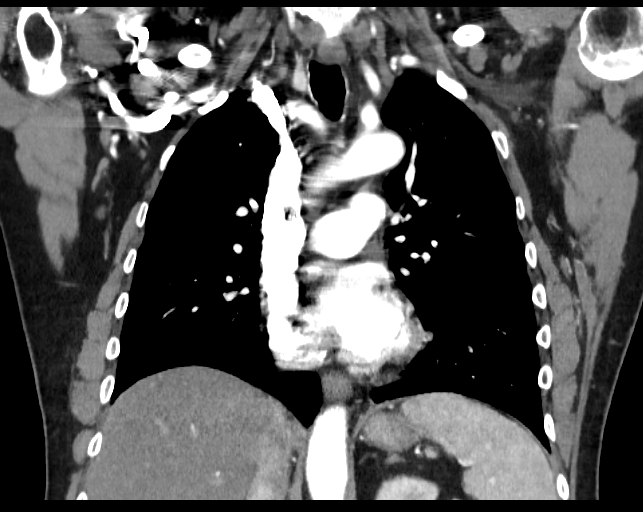

[16 of 36 positions shown; findings below may reference images not displayed]

FINDINGS: There is adequate opacification of the pulmonary arteries. There is
no pulmonary embolus. Previous demonstrated emboli are no longer
present. There is a thin web in a right lower lobe segmental branch
consistent with resolving pulmonary embolus. The main pulmonary
artery, right main pulmonary artery and left main pulmonary arteries
are normal in size. The heart size is normal. There is no
pericardial effusion. The RV/LV ratio measures 1.0.

The lungs are clear. There is no focal consolidation, pleural
effusion or pneumothorax.

There is no axillary lymphadenopathy. There is a mildly enlarged
right hilar lymph node measuring 15 mm in short axis.

There is no lytic or blastic osseous lesion.

The visualized portions of the upper abdomen are unremarkable.

Review of the MIP images confirms the above findings.
IMPRESSION: 1. Interval resolution of bilateral pulmonary emboli. Interval
resolution of right heart strain. There is a thin web in a right
lower lobe segmental branch consistent with resolving pulmonary
embolus.

## 2015-12-13 ENCOUNTER — Ambulatory Visit: Payer: Self-pay | Admitting: Physician Assistant

## 2015-12-13 VITALS — BP 110/80 | HR 76 | Temp 98.1°F

## 2015-12-13 DIAGNOSIS — Z4802 Encounter for removal of sutures: Secondary | ICD-10-CM

## 2015-12-13 NOTE — Progress Notes (Signed)
Fell last week, needs to have staples removed, has been 7 days since they were placed in scalp  O: vitals wnl nad, skin with 2 staples on r side of scalp, can still separate skin at suture line with a little oozing noted  A: recheck of wound/staples  P: will try to take staples out in 2 days

## 2015-12-15 ENCOUNTER — Ambulatory Visit: Payer: Self-pay | Admitting: Physician Assistant

## 2015-12-18 ENCOUNTER — Ambulatory Visit: Payer: Self-pay | Admitting: Physician Assistant

## 2015-12-18 ENCOUNTER — Encounter: Payer: Self-pay | Admitting: Physician Assistant

## 2015-12-18 VITALS — BP 120/80 | HR 76 | Temp 98.4°F

## 2015-12-18 DIAGNOSIS — Z4802 Encounter for removal of sutures: Secondary | ICD-10-CM

## 2015-12-18 NOTE — Progress Notes (Signed)
S: here for staple removal, has been more than 10 days since they were placed, no fever/chills or problems with suture area  O: vitals wnl, nad, skin with 2 staples, wound well approximated, n/v intact, removed 2 staples without difficulty  A: staple removal  P: f/u prn

## 2019-05-27 ENCOUNTER — Other Ambulatory Visit
Admission: RE | Admit: 2019-05-27 | Discharge: 2019-05-27 | Disposition: A | Discharge status: Home / Self Care | Payer: Medicare HMO | Source: Ambulatory Visit | Attending: Internal Medicine | Admitting: Internal Medicine

## 2019-05-27 ENCOUNTER — Other Ambulatory Visit: Payer: Self-pay

## 2019-05-27 DIAGNOSIS — Z20828 Contact with and (suspected) exposure to other viral communicable diseases: Secondary | ICD-10-CM | POA: Diagnosis not present

## 2019-05-27 DIAGNOSIS — Z01812 Encounter for preprocedural laboratory examination: Secondary | ICD-10-CM | POA: Insufficient documentation

## 2019-05-27 LAB — SARS CORONAVIRUS 2 (TAT 6-24 HRS): SARS Coronavirus 2: NEGATIVE

## 2019-05-28 ENCOUNTER — Encounter: Payer: Self-pay | Admitting: *Deleted

## 2019-05-31 ENCOUNTER — Encounter
Admission: RE | Disposition: A | Discharge status: Home / Self Care | Payer: Self-pay | Source: Home / Self Care | Attending: Internal Medicine

## 2019-05-31 ENCOUNTER — Ambulatory Visit: Payer: Medicare HMO | Admitting: Certified Registered Nurse Anesthetist

## 2019-05-31 ENCOUNTER — Ambulatory Visit
Admission: RE | Admit: 2019-05-31 | Discharge: 2019-05-31 | Disposition: A | Discharge status: Home / Self Care | Payer: Medicare HMO | Attending: Internal Medicine | Admitting: Internal Medicine

## 2019-05-31 ENCOUNTER — Encounter: Payer: Self-pay | Admitting: *Deleted

## 2019-05-31 DIAGNOSIS — F329 Major depressive disorder, single episode, unspecified: Secondary | ICD-10-CM | POA: Diagnosis not present

## 2019-05-31 DIAGNOSIS — A6000 Herpesviral infection of urogenital system, unspecified: Secondary | ICD-10-CM | POA: Insufficient documentation

## 2019-05-31 DIAGNOSIS — Z800 Family history of malignant neoplasm of digestive organs: Secondary | ICD-10-CM | POA: Insufficient documentation

## 2019-05-31 DIAGNOSIS — N400 Enlarged prostate without lower urinary tract symptoms: Secondary | ICD-10-CM | POA: Diagnosis not present

## 2019-05-31 DIAGNOSIS — I2510 Atherosclerotic heart disease of native coronary artery without angina pectoris: Secondary | ICD-10-CM | POA: Diagnosis not present

## 2019-05-31 DIAGNOSIS — Z86711 Personal history of pulmonary embolism: Secondary | ICD-10-CM | POA: Diagnosis not present

## 2019-05-31 DIAGNOSIS — Z86010 Personal history of colonic polyps: Secondary | ICD-10-CM | POA: Diagnosis not present

## 2019-05-31 DIAGNOSIS — Z79899 Other long term (current) drug therapy: Secondary | ICD-10-CM | POA: Diagnosis not present

## 2019-05-31 DIAGNOSIS — K640 First degree hemorrhoids: Secondary | ICD-10-CM | POA: Diagnosis not present

## 2019-05-31 DIAGNOSIS — Z7901 Long term (current) use of anticoagulants: Secondary | ICD-10-CM | POA: Insufficient documentation

## 2019-05-31 DIAGNOSIS — Z1211 Encounter for screening for malignant neoplasm of colon: Secondary | ICD-10-CM | POA: Insufficient documentation

## 2019-05-31 DIAGNOSIS — I10 Essential (primary) hypertension: Secondary | ICD-10-CM | POA: Diagnosis not present

## 2019-05-31 DIAGNOSIS — Z888 Allergy status to other drugs, medicaments and biological substances status: Secondary | ICD-10-CM | POA: Diagnosis not present

## 2019-05-31 HISTORY — DX: Long term (current) use of anticoagulants: Z79.01

## 2019-05-31 HISTORY — DX: Unspecified injury of unspecified eye and orbit, initial encounter: S05.90XA

## 2019-05-31 HISTORY — DX: Nonscarring hair loss, unspecified: L65.9

## 2019-05-31 HISTORY — DX: Impaired fasting glucose: R73.01

## 2019-05-31 HISTORY — DX: Benign prostatic hyperplasia without lower urinary tract symptoms: N40.0

## 2019-05-31 HISTORY — DX: Benign neoplasm of colon, unspecified: D12.6

## 2019-05-31 HISTORY — DX: Chronic vascular disorders of intestine: K55.1

## 2019-05-31 HISTORY — DX: Gastro-esophageal reflux disease without esophagitis: K21.9

## 2019-05-31 HISTORY — DX: Other chronic pain: G89.29

## 2019-05-31 HISTORY — DX: Herpesviral infection of urogenital system, unspecified: A60.00

## 2019-05-31 HISTORY — DX: Essential (primary) hypertension: I10

## 2019-05-31 HISTORY — DX: Male erectile dysfunction, unspecified: N52.9

## 2019-05-31 HISTORY — DX: Low back pain, unspecified: M54.50

## 2019-05-31 HISTORY — DX: Allergic rhinitis, unspecified: J30.9

## 2019-05-31 HISTORY — DX: Depression, unspecified: F32.A

## 2019-05-31 HISTORY — DX: Other pulmonary embolism without acute cor pulmonale: I26.99

## 2019-05-31 HISTORY — PX: COLONOSCOPY WITH PROPOFOL: SHX5780

## 2019-05-31 HISTORY — DX: Atherosclerotic heart disease of native coronary artery without angina pectoris: I25.10

## 2019-05-31 SURGERY — COLONOSCOPY WITH PROPOFOL
Anesthesia: General

## 2019-05-31 MED ORDER — PROPOFOL 500 MG/50ML IV EMUL
INTRAVENOUS | Status: DC | PRN
  Administered 2019-05-31: 160 ug/kg/min via INTRAVENOUS

## 2019-05-31 MED ORDER — PROPOFOL 10 MG/ML IV BOLUS
INTRAVENOUS | Status: DC | PRN
  Administered 2019-05-31: 70 mg via INTRAVENOUS
  Administered 2019-05-31: 30 mg via INTRAVENOUS

## 2019-05-31 MED ORDER — SODIUM CHLORIDE 0.9 % IV SOLN
INTRAVENOUS | Status: DC
  Administered 2019-05-31: 14:00:00 via INTRAVENOUS

## 2019-05-31 NOTE — Anesthesia Procedure Notes (Signed)
Performed by: Jomaira Darr, CRNA Pre-anesthesia Checklist: Patient identified, Emergency Drugs available, Suction available, Patient being monitored and Timeout performed Patient Re-evaluated:Patient Re-evaluated prior to induction Oxygen Delivery Method: Nasal cannula Induction Type: IV induction       

## 2019-05-31 NOTE — Transfer of Care (Signed)
Immediate Anesthesia Transfer of Care Note  Patient: Jacob Davenport  Procedure(s) Performed: COLONOSCOPY WITH PROPOFOL (N/A )  Patient Location: PACU  Anesthesia Type:General  Level of Consciousness: drowsy  Airway & Oxygen Therapy: Patient Spontanous Breathing and Patient connected to nasal cannula oxygen  Post-op Assessment: Report given to RN and Post -op Vital signs reviewed and stable  Post vital signs: Reviewed and stable  Last Vitals:  Vitals Value Taken Time  BP    Temp    Pulse 67 05/31/19 1436  Resp 17 05/31/19 1436  SpO2 98 % 05/31/19 1436  Vitals shown include unvalidated device data.  Last Pain:  Vitals:   05/31/19 1322  TempSrc: Tympanic  PainSc: 0-No pain         Complications: No apparent anesthesia complications

## 2019-05-31 NOTE — Anesthesia Post-op Follow-up Note (Signed)
Anesthesia QCDR form completed.        

## 2019-05-31 NOTE — H&P (Signed)
Outpatient short stay form Pre-procedure 05/31/2019 2:05 PM  K. Alice Reichert, M.D.  Primary Physician: Hortencia Pilar, M.D.  Reason for visit:  Personal hx of adenomatous colon polyps.  History of present illness:                            Patient presents for colonoscopy for a personal hx of colon polyps. The patient denies abdominal pain, abnormal weight loss or rectal bleeding.    Current Facility-Administered Medications:  .  0.9 %  sodium chloride infusion, , Intravenous, Continuous, [place], Benay Pike, MD, Last Rate: 20 mL/hr at 05/31/19 1342  Medications Prior to Admission  Medication Sig Dispense Refill Last Dose  . acyclovir (ZOVIRAX) 400 MG tablet    05/30/2019 at Unknown time  . amLODipine (NORVASC) 10 MG tablet Take by mouth.   05/31/2019 at 0900  . buPROPion (WELLBUTRIN XL) 300 MG 24 hr tablet Take by mouth.   05/30/2019 at Unknown time  . enoxaparin (LOVENOX) 40 MG/0.4ML injection Inject 40 mg into the skin 2 (two) times daily.   05/30/2019 at Unknown time  . escitalopram (LEXAPRO) 20 MG tablet    05/30/2019 at Unknown time  . finasteride (PROSCAR) 5 MG tablet Take 5 mg by mouth daily.   05/30/2019 at Unknown time  . Glucosamine-Chondroitin 500-400 MG CAPS Take by mouth.   05/30/2019 at Unknown time  . ketoconazole (NIZORAL) 2 % shampoo Apply 1 application topically 2 (two) times a week.   Past Week at Unknown time  . simvastatin (ZOCOR) 20 MG tablet Take by mouth.   05/30/2019 at Unknown time  . warfarin (COUMADIN) 4 MG tablet    05/30/2019 at Unknown time     Allergies  Allergen Reactions  . Rivaroxaban Itching     Past Medical History:  Diagnosis Date  . Adenomatous colon polyp   . Allergic rhinitis   . Alopecia   . Anticoagulated on Coumadin   . BPH (benign prostatic hyperplasia)   . Chronic lower back pain   . Coronary artery disease   . Depression   . Erectile dysfunction   . Eye trauma    steel splinter   . Genital herpes   . GERD  (gastroesophageal reflux disease)   . Hypertension   . Impaired fasting glucose   . Pulmonary embolism, bilateral ([place])   . Superior mesenteric artery stenosis ([place])     Review of systems:  Otherwise negative.    Physical Exam  Gen: Alert, oriented. Appears stated age.  HEENT: [place]/AT. PERRLA. Lungs: CTA, no wheezes. CV: RR nl S1, S2. Abd: soft, benign, no masses. BS+ Ext: No edema. Pulses 2+    Planned procedures: Proceed with colonoscopy. The patient understands the nature of the planned procedure, indications, risks, alternatives and potential complications including but not limited to bleeding, infection, perforation, damage to internal organs and possible oversedation/side effects from anesthesia. The patient agrees and gives consent to proceed.  Please refer to procedure notes for findings, recommendations and patient disposition/instructions.      K. Alice Reichert, M.D. Gastroenterology 05/31/2019  2:05 PM

## 2019-05-31 NOTE — Op Note (Signed)
[facility] Patient Name: Jacob Davenport Procedure Date: 05/31/2019 2:03 PM MRN: [id number] Account #: [id number] Date of Birth: Dec 09, 1950 Admit Type: Outpatient Age: 68 Room: [facility] ENDO ROOM 1 Gender: Male Note Status: Finalized Procedure:            Colonoscopy Indications:          Screening in patient at increased risk: Family history                        of 1st-degree relative with colorectal cancer Providers:            Benay Pike. Yerania Chamorro MD, MD Medicines:            Propofol per Anesthesia Complications:        No immediate complications. Procedure:            Pre-Anesthesia Assessment:                       - The risks and benefits of the procedure and the                        sedation options and risks were discussed with the                        patient. All questions were answered and informed                        consent was obtained.                       - Patient identification and proposed procedure were                        verified prior to the procedure by the nurse. The                        procedure was verified in the procedure room.                       - ASA Grade Assessment: III - A patient with severe                        systemic disease.                       - After reviewing the risks and benefits, the patient                        was deemed in satisfactory condition to undergo the                        procedure.                       After obtaining informed consent, the colonoscope was                        passed under direct vision. Throughout the procedure,                        the patient's blood pressure, pulse, and oxygen  saturations were monitored continuously. The                        Colonoscope was introduced through the anus and                        advanced to the the cecum, identified by appendiceal                        orifice and ileocecal valve.  The colonoscopy was                        performed without difficulty. The patient tolerated the                        procedure well. The quality of the bowel preparation                        was good. Findings:      The perianal and digital rectal examinations were normal. Pertinent       negatives include normal sphincter tone and no palpable rectal lesions.      The colon (entire examined portion) appeared normal.      Non-bleeding internal hemorrhoids were found during retroflexion. The       hemorrhoids were Grade I (internal hemorrhoids that do not prolapse). Impression:           - The entire examined colon is normal.                       - Non-bleeding internal hemorrhoids.                       - No specimens collected. Recommendation:       - Patient has a contact number available for                        emergencies. The signs and symptoms of potential                        delayed complications were discussed with the patient.                        Return to normal activities tomorrow. Written discharge                        instructions were provided to the patient.                       - Resume previous diet.                       - Continue present medications.                       - Repeat colonoscopy in 5 years for screening purposes.                       - The findings and recommendations were discussed with                        the patient. Procedure Code(s):    ---  Professional ---                       [id number], Colorectal cancer screening; colonoscopy on                        individual at high risk Diagnosis Code(s):    --- Professional ---                       K64.0, First degree hemorrhoids                       Z80.0, Family history of malignant neoplasm of                        digestive organs CPT copyright 2019 [employer]. All rights reserved. The codes documented in this report are preliminary and upon coder review may   be revised to meet current compliance requirements. Efrain Sella MD, MD 05/31/2019 2:38:23 PM This report has been signed electronically. Number of Addenda: 0 Note Initiated On: 05/31/2019 2:03 PM Scope Withdrawal Time: 0 hours 7 minutes 31 seconds  Total Procedure Duration: 0 hours 11 minutes 57 seconds  Estimated Blood Loss: Estimated blood loss: none.      [facility]

## 2019-05-31 NOTE — Anesthesia Preprocedure Evaluation (Signed)
  Anesthesia Plan  ASA: II  Anesthesia Plan: General   Post-op Pain Management:    Induction:   PONV Risk Score and Plan:   Airway Management Planned:   Additional Equipment:   Intra-op Plan:   Post-operative Plan:   Informed Consent: I have reviewed the patients History and Physical, chart, labs and discussed the procedure including the risks, benefits and alternatives for the proposed anesthesia with the patient or authorized representative who has indicated his/her understanding and acceptance.   Dental Advisory Given  Plan Discussed with: CRNA  Anesthesia Plan Comments:         Anesthesia Quick Evaluation  

## 2019-05-31 NOTE — Interval H&P Note (Signed)
History and Physical Interval Note:  05/31/2019 2:08 PM  Jacob Davenport  has presented today for surgery, with the diagnosis of PERSONAL HX.OF COLON POLYPS,FAMILY HX.OF COLON CANCER.  The various methods of treatment have been discussed with the patient and family. After consideration of risks, benefits and other options for treatment, the patient has consented to  Procedure(s): COLONOSCOPY WITH PROPOFOL (N/A) as a surgical intervention.  The patient's history has been reviewed, patient examined, no change in status, stable for surgery.  I have reviewed the patient's chart and labs.  Questions were answered to the patient's satisfaction.     [place], [place]

## 2019-06-01 NOTE — Anesthesia Postprocedure Evaluation (Signed)
Anesthesia Post Note  Patient: Jacob Davenport  Procedure(s) Performed: COLONOSCOPY WITH PROPOFOL (N/A )  Patient location during evaluation: Endoscopy Anesthesia Type: General Level of consciousness: awake and alert Pain management: pain level controlled Vital Signs Assessment: post-procedure vital signs reviewed and stable Respiratory status: spontaneous breathing, nonlabored ventilation, respiratory function stable and patient connected to nasal cannula oxygen Cardiovascular status: blood pressure returned to baseline and stable Postop Assessment: no apparent nausea or vomiting Anesthetic complications: no     Last Vitals:  Vitals:   05/31/19 1501 05/31/19 1502  BP:  113/83  Pulse: 68 68  Resp: 12 12  Temp:    SpO2: 100% 100%    Last Pain:  Vitals:   05/31/19 1502  TempSrc:   PainSc: 0-No pain                 Martha Clan

## 2022-05-17 ENCOUNTER — Emergency Department
Admission: EM | Admit: 2022-05-17 | Discharge: 2022-05-17 | Disposition: A | Discharge status: Home / Self Care | Payer: Medicare HMO | Attending: Emergency Medicine | Admitting: Emergency Medicine

## 2022-05-17 ENCOUNTER — Other Ambulatory Visit: Payer: Self-pay

## 2022-05-17 DIAGNOSIS — S61011A Laceration without foreign body of right thumb without damage to nail, initial encounter: Secondary | ICD-10-CM | POA: Diagnosis present

## 2022-05-17 DIAGNOSIS — X58XXXA Exposure to other specified factors, initial encounter: Secondary | ICD-10-CM | POA: Diagnosis not present

## 2022-05-17 DIAGNOSIS — Z23 Encounter for immunization: Secondary | ICD-10-CM | POA: Insufficient documentation

## 2022-05-17 MED ORDER — LIDOCAINE HCL 1 % IJ SOLN
5.0000 mL | Freq: Once | INTRAMUSCULAR | Status: AC
  Administered 2022-05-17: 5 mL
  Filled 2022-05-17: qty 10

## 2022-05-17 MED ORDER — TETANUS-DIPHTH-ACELL PERTUSSIS 5-2.5-18.5 LF-MCG/0.5 IM SUSY
0.5000 mL | PREFILLED_SYRINGE | Freq: Once | INTRAMUSCULAR | Status: AC
  Administered 2022-05-17: 0.5 mL via INTRAMUSCULAR
  Filled 2022-05-17: qty 0.5

## 2022-05-17 MED ORDER — CEPHALEXIN 500 MG PO CAPS: 500.0000 mg | ORAL_CAPSULE | Freq: Four times a day (QID) | ORAL | 0 refills | Status: AC

## 2022-05-17 NOTE — ED Provider Notes (Signed)
[facility] Provider Note  Patient Contact: 9:41 PM (approximate)   History   Laceration   HPI  Jacob Davenport is a 71 y.o. male presents to the emergency department with with a 2 cm laceration at the base of the right thumb.  No numbness or tingling.  Tetanus status is out of date.  No similar injuries in the past.      Physical Exam   Triage Vital Signs: ED Triage Vitals  Enc Vitals Group     BP 05/17/22 1933 (!) 151/84     Pulse Rate 05/17/22 1933 83     Resp 05/17/22 1933 20     Temp 05/17/22 1933 98.2 F (36.8 C)     Temp Source 05/17/22 1933 Oral     SpO2 05/17/22 1933 97 %     Weight 05/17/22 1943 176 lb (79.8 kg)     Height 05/17/22 1943 '5\' 10"'$  (1.778 m)     Head Circumference --      Peak Flow --      Pain Score 05/17/22 1943 3     Pain Loc --      Pain Edu? --      Excl. in [place]? --     Most recent vital signs: Vitals:   05/17/22 1933 05/17/22 2238  BP: (!) 151/84   Pulse: 83 78  Resp: 20 18  Temp: 98.2 F (36.8 C)   SpO2: 97% 97%     General: Alert and in no acute distress. Eyes:  PERRL. EOMI. Head: No acute traumatic findings ENT:      Nose: No congestion/rhinnorhea.      Mouth/Throat: Mucous membranes are moist.  Neck: No stridor. No cervical spine tenderness to palpation. Cardiovascular:  Good peripheral perfusion Respiratory: Normal respiratory effort without tachypnea or retractions. Lungs CTAB. Good air entry to the bases with no decreased or absent breath sounds. Gastrointestinal: Bowel sounds 4 quadrants. Soft and nontender to palpation. No guarding or rigidity. No palpable masses. No distention. No CVA tenderness. Musculoskeletal: Full range of motion to all extremities.  Neurologic:  No gross focal neurologic deficits are appreciated.  Skin: Patient has a 2 cm laceration along the superior aspect of the right thenar eminence.  Laceration is deep to underlying adipose tissue. Other:   ED Results /  Procedures / Treatments   Labs (all labs ordered are listed, but only abnormal results are displayed) Labs Reviewed - No data to display         PROCEDURES:  Critical Care performed: No  ..Laceration Repair  Date/Time: 05/17/2022 9:45 PM  Performed by: Harless Litten, Student-PA Authorized by: Lannie Fields, PA-C   Consent:    Consent obtained:  Verbal   Risks discussed:  Infection and pain Universal protocol:    Procedure explained and questions answered to patient or proxy's satisfaction: yes     Patient identity confirmed:  Verbally with patient Anesthesia:    Anesthesia method:  Local infiltration   Local anesthetic:  Lidocaine 1% w/o epi Laceration details:    Location:  Finger   Finger location:  R thumb   Length (cm):  2   Depth (mm):  5 Pre-procedure details:    Preparation:  Patient was prepped and draped in usual sterile fashion Exploration:    Limited defect created (wound extended): no     Imaging outcome: foreign body not noted     Contaminated: no   Treatment:    Area cleansed with:  Povidone-iodine   Amount of cleaning:  Standard   Visualized foreign bodies/material removed: no     Debridement:  None   Undermining:  None   Scar revision: no   Skin repair:    Repair method:  Sutures   Suture size:  4-0   Suture technique:  Simple interrupted   Number of sutures:  5 Approximation:    Approximation:  Close Repair type:    Repair type:  Simple Post-procedure details:    Dressing:  Non-adherent dressing    MEDICATIONS ORDERED IN ED: Medications  Tdap (BOOSTRIX) injection 0.5 mL (0.5 mLs Intramuscular Given 05/17/22 2144)  lidocaine (XYLOCAINE) 1 % (with pres) injection 5 mL (5 mLs Infiltration Given 05/17/22 2145)     IMPRESSION / MDM / ASSESSMENT AND PLAN / ED COURSE  I reviewed the triage vital signs and the nursing notes.                              Assessment and plan: Laceration:  71 year old male presents to the  emergency department with a 2 cm laceration along the thenar eminence on the right.  Laceration was repaired without complication.  Recommended suture removal in 7 days.  Patient was discharged with Keflex and tetanus status was updated.  Return precautions were given to return with new or worsening symptoms.  All patient questions were answered.     FINAL CLINICAL IMPRESSION(S) / ED DIAGNOSES   Final diagnoses:  Laceration of right thumb without foreign body without damage to nail, initial encounter     Rx / DC Orders   ED Discharge Orders          Ordered    cephALEXin (KEFLEX) 500 MG capsule  4 times daily        05/17/22 2225             Note:  This document was prepared using Dragon voice recognition software and may include unintentional dictation errors.   Vallarie Mare [place]us, PA-C 05/17/22 2321    Carrie Mew, MD 05/18/22 0021

## 2022-05-17 NOTE — ED Triage Notes (Signed)
Pt arrives with c/o right thumb laceration. Bleeding is controlled.

## 2022-05-17 NOTE — ED Notes (Signed)
ED Provider at bedside. 

## 2022-05-17 NOTE — ED Notes (Signed)
Signing pad did not work. Pt verbalized understanding of DC instructions. 

## 2022-05-17 NOTE — Discharge Instructions (Signed)
Take Keflex four times daily for the next seven days. Have sutures removed in seven days.  Return with redness or streaking surrounding the wound site.

## 2024-03-24 ENCOUNTER — Telehealth: Payer: Self-pay | Admitting: Physical Therapy

## 2024-03-24 ENCOUNTER — Ambulatory Visit: Admitting: Anesthesiology

## 2024-03-24 ENCOUNTER — Encounter
Admission: RE | Disposition: A | Discharge status: Home / Self Care | Payer: Self-pay | Source: Home / Self Care | Attending: Internal Medicine

## 2024-03-24 ENCOUNTER — Ambulatory Visit
Admission: RE | Admit: 2024-03-24 | Discharge: 2024-03-24 | Disposition: A | Discharge status: Home / Self Care | Attending: Internal Medicine | Admitting: Internal Medicine

## 2024-03-24 ENCOUNTER — Encounter: Payer: Self-pay | Admitting: Internal Medicine

## 2024-03-24 DIAGNOSIS — D125 Benign neoplasm of sigmoid colon: Secondary | ICD-10-CM | POA: Insufficient documentation

## 2024-03-24 DIAGNOSIS — Z1211 Encounter for screening for malignant neoplasm of colon: Secondary | ICD-10-CM | POA: Insufficient documentation

## 2024-03-24 DIAGNOSIS — Z800 Family history of malignant neoplasm of digestive organs: Secondary | ICD-10-CM | POA: Diagnosis not present

## 2024-03-24 DIAGNOSIS — Z7901 Long term (current) use of anticoagulants: Secondary | ICD-10-CM | POA: Diagnosis not present

## 2024-03-24 DIAGNOSIS — I10 Essential (primary) hypertension: Secondary | ICD-10-CM | POA: Diagnosis not present

## 2024-03-24 DIAGNOSIS — F32A Depression, unspecified: Secondary | ICD-10-CM | POA: Diagnosis not present

## 2024-03-24 DIAGNOSIS — I2510 Atherosclerotic heart disease of native coronary artery without angina pectoris: Secondary | ICD-10-CM | POA: Diagnosis not present

## 2024-03-24 HISTORY — PX: POLYPECTOMY: SHX149

## 2024-03-24 HISTORY — PX: COLONOSCOPY: SHX5424

## 2024-03-24 SURGERY — COLONOSCOPY
Anesthesia: General

## 2024-03-24 MED ORDER — PROPOFOL 500 MG/50ML IV EMUL
INTRAVENOUS | Status: DC | PRN
Start: 2024-03-24 — End: 2024-03-24
  Administered 2024-03-24: 40 ug/kg/min via INTRAVENOUS
  Administered 2024-03-24: 100 ug/kg/min via INTRAVENOUS
  Administered 2024-03-24: 40 ug/kg/min via INTRAVENOUS
  Administered 2024-03-24: 100 ug/kg/min via INTRAVENOUS

## 2024-03-24 MED ORDER — LIDOCAINE HCL (PF) 2 % IJ SOLN
INTRAMUSCULAR | Status: AC
  Filled 2024-03-24: qty 5

## 2024-03-24 MED ORDER — LIDOCAINE HCL (CARDIAC) PF 100 MG/5ML IV SOSY
PREFILLED_SYRINGE | INTRAVENOUS | Status: DC | PRN
  Administered 2024-03-24 (×2): 50 mg via INTRAVENOUS

## 2024-03-24 MED ORDER — SODIUM CHLORIDE 0.9 % IV SOLN: INTRAVENOUS | Status: DC

## 2024-03-24 NOTE — Anesthesia Preprocedure Evaluation (Signed)
 Anesthesia Evaluation  Patient identified by MRN, date of birth, ID band Patient awake    Reviewed: Allergy & Precautions, NPO status , Patient's Chart, lab work & pertinent test results  History of Anesthesia Complications Negative for: history of anesthetic complications  Airway Mallampati: II  TM Distance: >3 FB Neck ROM: full    Dental no notable dental hx.    Pulmonary neg pulmonary ROS   Pulmonary exam normal        Cardiovascular hypertension, + CAD and + Peripheral Vascular Disease  Normal cardiovascular exam     Neuro/Psych  PSYCHIATRIC DISORDERS  Depression     Neuromuscular disease    GI/Hepatic Neg liver ROS,GERD  ,,  Endo/Other  negative endocrine ROS    Renal/GU negative Renal ROS  negative genitourinary   Musculoskeletal   Abdominal   Peds  Hematology negative hematology ROS (+)   Anesthesia Other Findings Past Medical History: No date: Adenomatous colon polyp No date: Allergic rhinitis No date: Alopecia No date: Anticoagulated on Coumadin No date: BPH (benign prostatic hyperplasia) No date: Chronic lower back pain No date: Coronary artery disease No date: Depression No date: Erectile dysfunction No date: Eye trauma     Comment:  steel splinter  No date: Genital herpes No date: GERD (gastroesophageal reflux disease) No date: Hypertension No date: Impaired fasting glucose No date: Pulmonary embolism, bilateral (HCC) No date: Superior mesenteric artery stenosis (HCC)  Past Surgical History: No date: COLONOSCOPY 05/31/2019: COLONOSCOPY WITH PROPOFOL ; N/A     Comment:  Procedure: COLONOSCOPY WITH PROPOFOL ;  Surgeon: [place],               Ladell POUR, MD;  Location: [facility] ENDOSCOPY;  Service:               Gastroenterology;  Laterality: N/A; No date: HEMORRHOID SURGERY     Reproductive/Obstetrics negative OB ROS                              Anesthesia  Physical Anesthesia Plan  ASA: 3  Anesthesia Plan: General   Post-op Pain Management: Minimal or no pain anticipated   Induction: Intravenous  PONV Risk Score and Plan: Propofol  infusion and TIVA  Airway Management Planned: Natural Airway and Nasal Cannula  Additional Equipment:   Intra-op Plan:   Post-operative Plan:   Informed Consent: I have reviewed the patients History and Physical, chart, labs and discussed the procedure including the risks, benefits and alternatives for the proposed anesthesia with the patient or authorized representative who has indicated his/her understanding and acceptance.     Dental Advisory Given  Plan Discussed with: Anesthesiologist, CRNA and Surgeon  Anesthesia Plan Comments: (Patient consented for risks of anesthesia including but not limited to:  - adverse reactions to medications - risk of airway placement if required - damage to eyes, teeth, lips or other oral mucosa - nerve damage due to positioning  - sore throat or hoarseness - Damage to heart, brain, nerves, lungs, other parts of body or loss of life  Patient voiced understanding and assent.)         Anesthesia Quick Evaluation

## 2024-03-24 NOTE — H&P (Signed)
 Outpatient short stay form Pre-procedure 03/24/2024 2:32 PM Alanya Vukelich K. Aundria, M.D.  Primary Physician: Ike Lavender, M.D.  Reason for visit:  Hematochezia  History of present illness:  Mr. Kuennen presents to the [facility] GI clinic at the request of Dr. Ike Lavender at [facility] for chief complaint of intermittent rectal bleeding. He presents to the clinic by himself. He reports he has been noticing intermittent, painless rectal bleeding for the past 23-months. Initially the blood was quite a bit with bright red blood seen in the toilet bowl and also on the tissue paper. This occurred daily for several days and then went away on its own. However over the past few months he has seen bright red blood in his stool and on tissue paper after a bowel movement. Last episode was last week seeing one drop of blood in his stool. He denies any rectal pain, pruritus, or burning. He denies any abdominal pain or abdominal cramping. Appetite and diet are stable without any unintentional weight loss. He has noticed that stools appear much larger in volume than he is accustomed to. He denies any diarrhea or constipation. He reports he is due for a colonoscopy this year and would like to get this scheduled. He has personal hx of adenomatous colon polyps. Last colonoscopy Oct 2020 showed normal examined colon. His father was diagnosed with colon cancer around age 69. He denies any UGI symptoms such as heartburn, reflux, esophageal dysphagia, odynophagia, early satiety, hoarseness, or epigastric abdominal pain. He is on chronic anticoagulation with Eliquis.      Current Facility-Administered Medications:    0.9 %  sodium chloride  infusion, , Intravenous, Continuous, [place], Dawnette Mione K, MD, Last Rate: 20 mL/hr at 03/24/24 1418, Continued from Pre-op at 03/24/24 1418  Medications Prior to Admission  Medication Sig Dispense Refill Last Dose/Taking   acyclovir (ZOVIRAX) 400 MG tablet    Past Week    amLODipine (NORVASC) 10 MG tablet Take by mouth.   03/24/2024 at  6:30 AM   apixaban (ELIQUIS) 2.5 MG TABS tablet Take 5 mg by mouth 2 (two) times daily.   03/21/2024 at  6:00 PM   escitalopram (LEXAPRO) 20 MG tablet    03/21/2024 at  6:00 AM   ketoconazole (NIZORAL) 2 % shampoo Apply 1 application topically 2 (two) times a week.   Past Month   minoxidil (LONITEN) 2.5 MG tablet Take by mouth daily.   03/21/2024 at  6:00 PM   buPROPion (WELLBUTRIN XL) 300 MG 24 hr tablet Take by mouth.   03/21/2024 at  6:00 AM   enoxaparin (LOVENOX) 40 MG/0.4ML injection Inject 40 mg into the skin 2 (two) times daily. (Patient not taking: Reported on 03/24/2024)   Not Taking   finasteride (PROSCAR) 5 MG tablet Take 5 mg by mouth daily.   03/21/2024 at  6:00 PM   Glucosamine-Chondroitin 500-400 MG CAPS Take by mouth.   03/21/2024 at  6:00 PM   simvastatin (ZOCOR) 20 MG tablet Take by mouth.      warfarin (COUMADIN) 4 MG tablet  (Patient not taking: Reported on 03/24/2024)   Not Taking     Allergies  Allergen Reactions   Rivaroxaban Itching     Past Medical History:  Diagnosis Date   Adenomatous colon polyp    Allergic rhinitis    Alopecia    Anticoagulated on Coumadin    BPH (benign prostatic hyperplasia)    Chronic lower back pain    Coronary artery disease  Depression    Erectile dysfunction    Eye trauma    steel splinter    Genital herpes    GERD (gastroesophageal reflux disease)    Hypertension    Impaired fasting glucose    Pulmonary embolism, bilateral (HCC)    Superior mesenteric artery stenosis (HCC)     Review of systems:  Otherwise negative.    Physical Exam  Gen: Alert, oriented. Appears stated age.  HEENT: [place]/AT. PERRLA. Lungs: CTA, no wheezes. CV: RR nl S1, S2. Abd: soft, benign, no masses. BS+ Ext: No edema. Pulses 2+    Planned procedures: Proceed with colonoscopy. The patient understands the nature of the planned procedure, indications, risks, alternatives and potential  complications including but not limited to bleeding, infection, perforation, damage to internal organs and possible oversedation/side effects from anesthesia. The patient agrees and gives consent to proceed.  Please refer to procedure notes for findings, recommendations and patient disposition/instructions.     Elisabet Gutzmer K. Aundria, M.D. Gastroenterology 03/24/2024  2:32 PM

## 2024-03-24 NOTE — Op Note (Signed)
 [facility] Gastroenterology Patient Name: Jacob Davenport Procedure Date: 03/24/2024 2:34 PM MRN: [id number] Account #: [id number] Date of Birth: Dec 08, 1950 Admit Type: Outpatient Age: 73 Room: [facility] ENDO ROOM 2 Gender: Male Note Status: Supervisor Override Instrument Name: Colon Scope [id number] Procedure:             Colonoscopy Indications:           High risk colon cancer surveillance: Personal history                         of non-advanced adenoma, Family history of colon                         cancer in a first-degree relative before age 34 years,                         Rectal bleeding Providers:             Malya Cirillo K. Aundria MD, MD Referring MD:          Ike EMERSON Lavender MD, MD (Referring MD) Medicines:             Propofol  per Anesthesia Complications:         No immediate complications. Estimated blood loss: None. Procedure:             Pre-Anesthesia Assessment:                        - The risks and benefits of the procedure and the                         sedation options and risks were discussed with the                         patient. All questions were answered and informed                         consent was obtained.                        - Patient identification and proposed procedure were                         verified prior to the procedure by the nurse. The                         procedure was verified in the procedure room.                        - ASA Grade Assessment: III - A patient with severe                         systemic disease.                        - After reviewing the risks and benefits, the patient                         was deemed in satisfactory condition to undergo the  procedure.                        After obtaining informed consent, the colonoscope was                         passed under direct vision. Throughout the procedure,                         the patient's blood pressure, pulse,  and oxygen                         saturations were monitored continuously. The                         Colonoscope was introduced through the anus and                         advanced to the the cecum, identified by appendiceal                         orifice and ileocecal valve. The colonoscopy was                         performed without difficulty. The patient tolerated                         the procedure well. The quality of the bowel                         preparation was good. The ileocecal valve, appendiceal                         orifice, and rectum were photographed. Findings:      The perianal and digital rectal examinations were normal. Pertinent       negatives include normal sphincter tone and no palpable rectal lesions.      A 12 mm polyp was found in the sigmoid colon. The polyp was       semi-pedunculated. The polyp was removed with a piecemeal technique       using a hot snare. Resection and retrieval were complete. Estimated       blood loss: none.      The exam was otherwise without abnormality on direct and retroflexion       views. Impression:            - One 12 mm polyp in the sigmoid colon, removed                         piecemeal using a hot snare. Resected and retrieved.                        - The examination was otherwise normal on direct and                         retroflexion views. Recommendation:        - Patient has a contact number available for  emergencies. The signs and symptoms of potential                         delayed complications were discussed with the patient.                         Return to normal activities tomorrow. Written                         discharge instructions were provided to the patient.                        - Resume previous diet.                        - Continue present medications.                        - Repeat colonoscopy is recommended for surveillance.                         The  colonoscopy date will be determined after                         pathology results from today's exam become available                         for review.                        - Return to my office PRN.                        - The findings and recommendations were discussed with                         the patient. Procedure Code(s):     --- Professional ---                        [phone], Colonoscopy, flexible; with removal of                         tumor(s), polyp(s), or other lesion(s) by snare                         technique Diagnosis Code(s):     --- Professional ---                        D12.5, Benign neoplasm of sigmoid colon                        Z86.010, Personal history of colonic polyps CPT copyright 2022 American [employer] Association. All rights reserved. The codes documented in this report are preliminary and upon coder review may  be revised to meet current compliance requirements. Ladell MARLA Boss MD, MD 03/24/2024 3:02:51 PM This report has been signed electronically. Number of Addenda: 0 Note Initiated On: 03/24/2024 2:34 PM Scope Withdrawal Time: 0 hours 6 minutes 46 seconds  Total Procedure Duration: 0 hours 12 minutes 57 seconds  Estimated Blood Loss:  Estimated blood loss:  none.      [place]

## 2024-03-24 NOTE — Interval H&P Note (Signed)
 History and Physical Interval Note:  03/24/2024 2:34 PM  Jacob Davenport  has presented today for surgery, with the diagnosis of K62.5 (ICD-10-CM) - Rectal bleeding Z86.0101 (ICD-10-CM) - Hx of adenomatous colonic polyps.  The various methods of treatment have been discussed with the patient and family. After consideration of risks, benefits and other options for treatment, the patient has consented to  Procedure(s): COLONOSCOPY (N/A) as a surgical intervention.  The patient's history has been reviewed, patient examined, no change in status, stable for surgery.  I have reviewed the patient's chart and labs.  Questions were answered to the patient's satisfaction.     [place], Milca Sytsma

## 2024-03-24 NOTE — Telephone Encounter (Signed)
 Called pt to inquire about whether he wanted to move up his eval sooner given increased availability in schedule. Did not reach pt so left VM instructing pt to call back.

## 2024-03-24 NOTE — Transfer of Care (Signed)
 Immediate Anesthesia Transfer of Care Note  Patient: Jacob Davenport  Procedure(s) Performed: COLONOSCOPY POLYPECTOMY, INTESTINE  Patient Location: PACU  Anesthesia Type:MAC  Level of Consciousness: sedated  Airway & Oxygen Therapy: Patient Spontanous Breathing  Post-op Assessment: Report given to RN and Post -op Vital signs reviewed and stable  Post vital signs: stable  Last Vitals:  Vitals Value Taken Time  BP 90/54 03/24/24 15:03  Temp 36.1 C 03/24/24 15:03  Pulse 67 03/24/24 15:09  Resp 20 03/24/24 15:09  SpO2 99 % 03/24/24 15:09  Vitals shown include unfiled device data.  Last Pain:  Vitals:   03/24/24 1503  TempSrc:   PainSc: Asleep         Complications: No notable events documented.

## 2024-03-25 ENCOUNTER — Ambulatory Visit: Attending: Family Medicine | Admitting: Physical Therapy

## 2024-03-25 DIAGNOSIS — M25561 Pain in right knee: Secondary | ICD-10-CM | POA: Diagnosis present

## 2024-03-25 DIAGNOSIS — G8929 Other chronic pain: Secondary | ICD-10-CM | POA: Diagnosis present

## 2024-03-25 LAB — SURGICAL PATHOLOGY

## 2024-03-25 NOTE — Therapy (Signed)
 OUTPATIENT PHYSICAL THERAPY LOWER EXTREMITY EVALUATION   Patient Name: Jacob Davenport MRN: [id number] DOB:10-25-1950, 73 y.o., male Today's Date: 03/25/2024  END OF SESSION:  PT End of Session - 03/25/24 1806     Visit Number 1    Number of Visits 24    Date for PT Re-Evaluation 06/17/24    Authorization Type [employer] 2025    Authorization - Visit Number 1    Authorization - Number of Visits 24    Progress Note Due on Visit 10    PT Start Time 1600    PT Stop Time 1645    PT Time Calculation (min) 45 min    Activity Tolerance Patient tolerated treatment well    Behavior During Therapy WFL for tasks assessed/performed          Past Medical History:  Diagnosis Date   Adenomatous colon polyp    Allergic rhinitis    Alopecia    Anticoagulated on Coumadin    BPH (benign prostatic hyperplasia)    Chronic lower back pain    Coronary artery disease    Depression    Erectile dysfunction    Eye trauma    steel splinter    Genital herpes    GERD (gastroesophageal reflux disease)    Hypertension    Impaired fasting glucose    Pulmonary embolism, bilateral (HCC)    Superior mesenteric artery stenosis (HCC)    Past Surgical History:  Procedure Laterality Date   COLONOSCOPY     COLONOSCOPY N/A 03/24/2024   Procedure: COLONOSCOPY;  Surgeon: Toledo, Ladell POUR, MD;  Location: [facility];  Service: Gastroenterology;  Laterality: N/A;   COLONOSCOPY WITH PROPOFOL  N/A 05/31/2019   Procedure: COLONOSCOPY WITH PROPOFOL ;  Surgeon: Toledo, Ladell POUR, MD;  Location: [facility];  Service: Gastroenterology;  Laterality: N/A;   HEMORRHOID SURGERY     POLYPECTOMY  03/24/2024   Procedure: POLYPECTOMY, INTESTINE;  Surgeon: Toledo, Teodoro K, MD;  Location: [facility];  Service: Gastroenterology;;   There are no active problems to display for this patient.   PCP: Dr. Ike Lavender     REFERRING PROVIDER: Dr. 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 He started to experience pain when crawling in his attic especially in right knee and from standing for long periods of time. He mainly experiences the pain in the medial side of his right knee and he received imaging that show joint space narrowing along the medial compartment of the right knee and mainly when he is weight bearing. He has spent the past couple of weeks off of his feet to see if this makes a difference in improving his knee pain.    PAIN:  Are you having pain? Yes: NPRS scale: 6-7/10 NRPS at worst    Pain location: Medial side of patella on right knee    Pain description: Sharp  Aggravating factors: Weight bearing especially standing for long periods of time or kneeling on his right knee  Relieving factors: Non-weight bearing like sitting and resting      PRECAUTIONS: None  RED FLAGS: None   WEIGHT BEARING RESTRICTIONS: No  FALLS:  Has patient fallen in last 6 months?  No  LIVING ENVIRONMENT: Lives with: lives with their spouse Lives in: House/apartment Stairs: Yes: Internal: 13 steps; on right going up and External: 2 steps; none Has following equipment at home: Single point cane and Walker - 2 wheeled  OCCUPATION: Retired     PLOF: Independent  PATIENT GOALS: Pt wants to know what to do in order to get the knee to heal and wants to know what exercise to do in order to avoid injury.   NEXT MD VISIT: 06/2024     OBJECTIVE:  Note: Objective measures were completed at Evaluation unless otherwise noted.  VITALS BP 124/73 HR 79 Sp02 99%  DIAGNOSTIC FINDINGS: [place]    PATIENT SURVEYS:  KOOS JR: 15/28 (50%)  COGNITION: Overall cognitive status: Within functional  limits for tasks assessed     SENSATION: WFL  EDEMA:  Circumferential: R/L 15/14 3/4  MUSCLE LENGTH: Hamstrings: Right 90 deg; Left 90 deg Thomas test: NT  Ely's: + Bilaterally   POSTURE: rounded shoulders  PALPATION: TTP medial border of patella on right knee    LOWER EXTREMITY ROM:  Active ROM Right eval Left eval  Hip flexion    Hip extension    Hip abduction    Hip adduction    Hip internal rotation    Hip external rotation    Knee flexion 130 130  Knee extension 0 0  Ankle dorsiflexion    Ankle plantarflexion    Ankle inversion    Ankle eversion     (Blank rows = not tested)    LOWER EXTREMITY MMT:  MMT Right eval Left eval  Hip flexion 4 4  Hip extension 4- 4-  Hip abduction 4 4  Hip adduction 4- 4-  Hip internal rotation NT NT  Hip external rotation NT  NT   Knee flexion 4 4  Knee extension 4 4  Ankle dorsiflexion 4+ 4+  Ankle plantarflexion    Ankle inversion    Ankle eversion     (Blank rows = not tested)         LOWER EXTREMITY SPECIAL TESTS:  Knee special tests: Patellafemoral apprehension test: NT, Lateral pull sign: NT, Patellafemoral grind test: NT, and Step up/down test: NT   FUNCTIONAL TESTS:  Squats: NT  Step Over: NT    GAIT: Not performed                                                                                                                                  TREATMENT DATE: 03/25/24:  Supine Bridges 1 x 10    Prone Quad Stretches      PATIENT EDUCATION:  Education details: Form and technique for correct performance of exercise and explanation about underlying deficits.  Person educated: Patient Education method: Explanation, Demonstration, Verbal cues, and Handouts Education comprehension: verbalized understanding, returned demonstration, and verbal cues required  HOME EXERCISE PROGRAM: Access Code: [email or web address] URL: [email or web address] Date: 03/25/2024 Prepared by: Toribio  Joycelin Radloff  Exercises - Prone Brewing technologist with Strap  - 1 x daily - 7 x weekly - 2-3 reps - 60 sec  hold - Supine Bridge  - 3-4 x weekly - 3 sets - 10 reps  ASSESSMENT:  CLINICAL IMPRESSION: Patient is a 73 y.o. white male who was seen today for physical therapy evaluation and treatment for right knee pain. He has signs and symptoms of knee OA with age >85 yo, insidious onset, morning stiffness, boney tenderness to palpation, and no palpable warmth and increased pain with weight bearing.  He has deficits that include decreased hip and knee strength and hip flexibility as well as increased pain with weight bearing along with increased swelling in right knee. He will benefit from skilled PT to address these aforementioned deficits to return to standing for prolonged periods of time, climbing ladders and stairs, and kneeling down to complete home improvement projects to maintain his home without being limited by pain and discomfort.    OBJECTIVE IMPAIRMENTS: decreased strength, increased edema, impaired flexibility, and pain.   ACTIVITY LIMITATIONS: carrying, lifting, bending, standing, stairs, toileting, and locomotion level  PARTICIPATION LIMITATIONS: cleaning, shopping, community activity, and yard work  PERSONAL FACTORS: Age, Time since onset of injury/illness/exacerbation, and 1-2 comorbidities: CAD, BPH, depression and h/o of pulmonary embolism are also affecting patient's functional outcome.   REHAB POTENTIAL: Good  CLINICAL DECISION MAKING: Stable/uncomplicated  EVALUATION COMPLEXITY: Low   GOALS: Goals reviewed with patient? No  SHORT TERM GOALS: Target date: 04/08/2024  Patient will demonstrate undestanding of home exercise plan by performing exercises correctly with evidence of good carry over with min to no verbal or tactile cues .   Baseline: NT  Goal status: INITIAL  2.  Patient will demonstrate understanding of activity modifications while performing home improvement  tasks to avoid exacerbating knee pain like sitting while doing certain tasks to reduce incidence of provoking pain in his right knee.  Baseline: NT  Goal status: INITIAL    LONG TERM GOALS: Target date: 06/17/2024   Patient will demonstrate an improvement in right knee function as evidenced by a decrease in his KOOS Jr score by >=14 pts.  Baseline: 15/28 (50%) Goal status: INITIAL  2.  Patient will show an improvement in hip and knee strength by >=1/3 grade MMT (ie 4- to 4) for improved knee stability to return to perform prolonged standing and kneeling activities without being limited by pain so he can complete home improvement projects. Baseline: Knee Ext R/L 4/4, Knee Flex R/L 4/4, Hip Flex R/L 4/4, Hip Abd R/L 4/4, Hip Ext R/L 4-/4-, Hip IR R/L NT, Hip ER R/L NT   Goal status: INITIAL  3.  Patient will show improve hip flexor flexibility with no hip raise on Ely's test for improved knee function to perform prolonged standing and kneeling activities without being limited by pain and discomfort. Baseline: + Bilateral  Goal status: INITIAL  4.  Patient will be able to perform weight bearing activities without exceeding right knee pain >=4/10 as evidence of improved knee function and to resume home improvement tasks. Baseline: 6-7/10 in medial pole of patella on right knee   Goal status: INITIAL    PLAN:  PT FREQUENCY: 1-2x/week  PT DURATION: 12 weeks  PLANNED INTERVENTIONS: 97164- PT Re-evaluation, 97750- Physical Performance Testing, 97110-Therapeutic exercises, 97530- Therapeutic activity, [id number]- Neuromuscular re-education, 97535- Self Care, 02859- Manual therapy, [id number]- Gait training, [id number]- Orthotic Initial, [id number]- Canalith repositioning, [id number]- Aquatic Therapy, H9716- Electrical stimulation (unattended), [id number]-  Electrical stimulation (manual), 79439 (1-2 muscles), 20561 (3+ muscles)- Dry Needling, Patient/Family education, Balance training, Stair training, Taping, Joint  mobilization, Joint manipulation, Spinal manipulation, Spinal mobilization, Vestibular training, DME instructions, Cryotherapy, and Moist heat  PLAN FOR NEXT SESSION: Hip ER and IR MMT and ankle ROM measurements. Look at squat and step over. Progress hip extensor exercises: box squats  Toribio Servant PT, DPT  [facility] [address] [place], [address] Phone: [phone]   Fax:  [phone]

## 2024-03-29 ENCOUNTER — Ambulatory Visit: Admitting: Physical Therapy

## 2024-03-29 DIAGNOSIS — M25561 Pain in right knee: Secondary | ICD-10-CM | POA: Diagnosis not present

## 2024-03-29 DIAGNOSIS — G8929 Other chronic pain: Secondary | ICD-10-CM

## 2024-03-29 NOTE — Therapy (Signed)
 OUTPATIENT PHYSICAL THERAPY LOWER EXTREMITY TREATMENT     Patient Name: Jacob Davenport MRN: [id number] DOB:07/24/1951, 73 y.o., male Today's Date: 03/29/2024  END OF SESSION:  PT End of Session - 03/29/24 1033     Visit Number 2    Number of Visits 24    Date for PT Re-Evaluation 06/17/24    Authorization Type [employer] 2025    Authorization - Visit Number 2    Authorization - Number of Visits 24    Progress Note Due on Visit 10    PT Start Time 1033    PT Stop Time 1115    PT Time Calculation (min) 42 min    Activity Tolerance Patient tolerated treatment well    Behavior During Therapy WFL for tasks assessed/performed           Past Medical History:  Diagnosis Date   Adenomatous colon polyp    Allergic rhinitis    Alopecia    Anticoagulated on Coumadin    BPH (benign prostatic hyperplasia)    Chronic lower back pain    Coronary artery disease    Depression    Erectile dysfunction    Eye trauma    steel splinter    Genital herpes    GERD (gastroesophageal reflux disease)    Hypertension    Impaired fasting glucose    Pulmonary embolism, bilateral (HCC)    Superior mesenteric artery stenosis (HCC)    Past Surgical History:  Procedure Laterality Date   COLONOSCOPY     COLONOSCOPY N/A 03/24/2024   Procedure: COLONOSCOPY;  Surgeon: Toledo, Ladell POUR, MD;  Location: [facility];  Service: Gastroenterology;  Laterality: N/A;   COLONOSCOPY WITH PROPOFOL  N/A 05/31/2019   Procedure: COLONOSCOPY WITH PROPOFOL ;  Surgeon: Toledo, Ladell POUR, MD;  Location: [facility];  Service: Gastroenterology;  Laterality: N/A;   HEMORRHOID SURGERY     POLYPECTOMY  03/24/2024   Procedure: POLYPECTOMY, INTESTINE;  Surgeon: Toledo, Teodoro K, MD;  Location: [facility];  Service: Gastroenterology;;   There are no active problems to display for this patient.   PCP: Dr. Ike Lavender     REFERRING PROVIDER: Dr. Ike Lavender   REFERRING DIAG: Unicompartment  Arthritis in right knee     THERAPY DIAG:  Chronic pain of right knee  Rationale for Evaluation and Treatment: Rehabilitation  ONSET DATE: January 2025     SUBJECTIVE:   SUBJECTIVE STATEMENT: Pt states that the only pain he has been feeling has been from muscle soreness from doing the exercises. Otherwise, he has been doing great to the point where he was able to do yard work without being limited by pain and discomfort.   PERTINENT HISTORY: Pt has been remodeling his home for the past several months. He started to experience pain when crawling in his attic especially in right knee and from standing for long periods of time. He mainly experiences the pain in the medial side of his right knee and he received imaging that show joint space narrowing along the medial compartment of the right knee and mainly when he is weight bearing. He has spent the past couple of weeks off of his feet to see if this makes a difference in improving his knee pain.    PAIN:  Are you having pain? Yes: NPRS scale: 6-7/10 NRPS at worst    Pain location: Medial side of patella on right knee    Pain description: Sharp  Aggravating factors: Weight bearing especially standing for long periods of  time or kneeling on his right knee  Relieving factors: Non-weight bearing like sitting and resting      PRECAUTIONS: None  RED FLAGS: None   WEIGHT BEARING RESTRICTIONS: No  FALLS:  Has patient fallen in last 6 months? No  LIVING ENVIRONMENT: Lives with: lives with their spouse Lives in: House/apartment Stairs: Yes: Internal: 13 steps; on right going up and External: 2 steps; none Has following equipment at home: Single point cane and Walker - 2 wheeled  OCCUPATION: Retired     PLOF: Independent  PATIENT GOALS: Pt wants to know what to do in order to get the knee to heal and wants to know what exercise to do in order to avoid injury.   NEXT MD VISIT: 06/2024     OBJECTIVE:  Note: Objective measures were  completed at Evaluation unless otherwise noted.  VITALS BP 124/73 HR 79 Sp02 99%  DIAGNOSTIC FINDINGS: [place]    PATIENT SURVEYS:  KOOS JR: 15/28 (50%)  COGNITION: Overall cognitive status: Within functional limits for tasks assessed     SENSATION: WFL  EDEMA:  Circumferential: R/L 15/14 3/4  MUSCLE LENGTH: Hamstrings: Right 90 deg; Left 90 deg Thomas test: NT  Ely's: + Bilaterally   POSTURE: rounded shoulders  PALPATION: TTP medial border of patella on right knee    LOWER EXTREMITY ROM:  Active ROM Right eval Left eval  Hip flexion    Hip extension    Hip abduction    Hip adduction    Hip internal rotation    Hip external rotation    Knee flexion 130 130  Knee extension 0 0  Ankle dorsiflexion    Ankle plantarflexion    Ankle inversion    Ankle eversion     (Blank rows = not tested)    LOWER EXTREMITY MMT:  MMT Right eval Left eval  Hip flexion 4 4  Hip extension 4- 4-  Hip abduction 4 4  Hip adduction 4- 4-  Hip internal rotation 4 4  Hip external rotation 4  4   Knee flexion 4 4  Knee extension 4 4  Ankle dorsiflexion 4+ 4+  Ankle plantarflexion    Ankle inversion    Ankle eversion     (Blank rows = not tested)         LOWER EXTREMITY SPECIAL TESTS:  Knee special tests: Patellafemoral apprehension test: NT, Lateral pull sign: NT, Patellafemoral grind test: NT, and Step up/down test: NT   FUNCTIONAL TESTS:  Squats: No knee varus and valgus and able to reach 90 deg hip and knee flexion   Step Over: Able to step over 6 inch step onto RLE and down with LLE; no knee varus or valgus   GAIT: Not performed                                                                                                                                  TREATMENT  DATE:   03/29/24: THEREX: Pt did apply Voltaren to medial side of right knee. Matrix recumbent bicycle for 5 min    Step Over on 6 inch step: no pain and no valgus moment  Single Leg Stance  on RLE: 5 sec    Mini-Squat: no pain and no valgus or varus at knee    Palpation: Right pes anserinus TTP   Seated Sartorius stretch on RLE  2 x 60 sec   Standing Marches with two finger support non-alternating 2 x 10   Standing Marches with two finger support non-alternating with #2.5 AW 2 x 10   -min VC to decrease speed of eccentric  Standing Marches with two finger support non-alternating with #5 AW 2 x 10   -min VC to decrease speed of eccentric  Box Squat (hip and knee flexion 90/90) 2 x 10   -min VC to decrease eccentric portion of exercise and to maintain knee behind toes       PATIENT EDUCATION:  Education details: Form and technique for correct performance of exercise and explanation about underlying deficits.  Person educated: Patient Education method: Explanation, Demonstration, Verbal cues, and Handouts Education comprehension: verbalized understanding, returned demonstration, and verbal cues required  HOME EXERCISE PROGRAM: Access Code: [email or web address] URL: [email or web address] Date: 03/29/2024 Prepared by: Toribio Servant  Program Notes Seated knee bend 1 x per day 2-3 x per day for 60 sec   Exercises - Prone Quadriceps Stretch with Strap  - 1 x daily - 7 x weekly - 2-3 reps - 60 sec  hold - Squat with Chair Touch  - 3-4 x weekly - 3 sets - 10 reps - Standing Marching  - 3-4 x weekly - 3 sets - 10 reps   ASSESSMENT:  CLINICAL IMPRESSION: Pt shows ability to perform weight bearing exercises without an increase in his medial knee pain. It is unclear whether this is an effect from the recent use of Volatren. He has not resumed home improvement projects at this point, so added rest is helping here too. He will continue to benefit from skilled PT to address these aforementioned deficits to return to standing for prolonged periods of time, climbing ladders and stairs, and kneeling down to complete home improvement projects to maintain his home without being  limited by pain and discomfort.    OBJECTIVE IMPAIRMENTS: decreased strength, increased edema, impaired flexibility, and pain.   ACTIVITY LIMITATIONS: carrying, lifting, bending, standing, stairs, toileting, and locomotion level  PARTICIPATION LIMITATIONS: cleaning, shopping, community activity, and yard work  PERSONAL FACTORS: Age, Time since onset of injury/illness/exacerbation, and 1-2 comorbidities: CAD, BPH, depression and h/o of pulmonary embolism are also affecting patient's functional outcome.   REHAB POTENTIAL: Good  CLINICAL DECISION MAKING: Stable/uncomplicated  EVALUATION COMPLEXITY: Low   GOALS: Goals reviewed with patient? No  SHORT TERM GOALS: Target date: 04/08/2024  Patient will demonstrate undestanding of home exercise plan by performing exercises correctly with evidence of good carry over with min to no verbal or tactile cues .   Baseline: NT  03/29/24: Performing exercises indepencently   Goal status: ONGOING   2.  Patient will demonstrate understanding of activity modifications while performing home improvement tasks to avoid exacerbating knee pain like sitting while doing certain tasks to reduce incidence of provoking pain in his right knee.  Baseline: NT  Goal status: ONGOING      LONG TERM GOALS: Target date: 06/17/2024   Patient will demonstrate an improvement in right knee function as evidenced by a  decrease in his KOOS Jr score by >=14 pts.  Baseline: 15/28 (50%) Goal status: ONGOING    2.  Patient will show an improvement in hip and knee strength by >=1/3 grade MMT (ie 4- to 4) for improved knee stability to return to perform prolonged standing and kneeling activities without being limited by pain so he can complete home improvement projects. Baseline: Knee Ext R/L 4/4, Knee Flex R/L 4/4, Hip Flex R/L 4/4, Hip Abd R/L 4/4, Hip Ext R/L 4-/4-, Hip IR R/L 4/4, Hip ER R/L 4/4   Goal status: ONGOING    3.  Patient will show improve hip flexor flexibility  with no hip raise on Ely's test for improved knee function to perform prolonged standing and kneeling activities without being limited by pain and discomfort. Baseline: + Bilateral  Goal status: ONGOING    4.  Patient will be able to perform weight bearing activities without exceeding right knee pain >=4/10 as evidence of improved knee function and to resume home improvement tasks. Baseline: 6-7/10 in medial pole of patella on right knee   Goal status: ONGOING     PLAN:  PT FREQUENCY: 1-2x/week  PT DURATION: 12 weeks  PLANNED INTERVENTIONS: 97164- PT Re-evaluation, 97750- Physical Performance Testing, 97110-Therapeutic exercises, 97530- Therapeutic activity, [id number]- Neuromuscular re-education, 97535- Self Care, 02859- Manual therapy, [id number]- Gait training, [id number]- Orthotic Initial, [id number]- Canalith repositioning, [id number]- Aquatic Therapy, [id number]- Electrical stimulation (unattended), [phone]- Electrical stimulation (manual), 20560 (1-2 muscles), 20561 (3+ muscles)- Dry Needling, Patient/Family education, Balance training, Stair training, Taping, Joint mobilization, Joint manipulation, Spinal manipulation, Spinal mobilization, Vestibular training, DME instructions, Cryotherapy, and Moist heat  PLAN FOR NEXT SESSION: SLR with external rot for VMO activation. OMEGA machines for LE strengthening  and gait analysis.   Toribio Servant PT, DPT  [facility] [address], [place], [address] Phone: [phone]   Fax:  [phone]

## 2024-04-01 ENCOUNTER — Encounter: Payer: Self-pay | Admitting: Physical Therapy

## 2024-04-01 ENCOUNTER — Ambulatory Visit: Admitting: Physical Therapy

## 2024-04-01 DIAGNOSIS — M25561 Pain in right knee: Secondary | ICD-10-CM | POA: Diagnosis not present

## 2024-04-01 DIAGNOSIS — G8929 Other chronic pain: Secondary | ICD-10-CM

## 2024-04-01 NOTE — Anesthesia Postprocedure Evaluation (Signed)
 Anesthesia Post Note  Patient: Jacob Davenport  Procedure(s) Performed: COLONOSCOPY POLYPECTOMY, INTESTINE  Patient location during evaluation: Endoscopy Anesthesia Type: General Level of consciousness: awake and alert Pain management: pain level controlled Vital Signs Assessment: post-procedure vital signs reviewed and stable Respiratory status: spontaneous breathing, nonlabored ventilation, respiratory function stable and patient connected to nasal cannula oxygen Cardiovascular status: blood pressure returned to baseline and stable Postop Assessment: no apparent nausea or vomiting Anesthetic complications: no   No notable events documented.   Last Vitals:  Vitals:   03/24/24 1513 03/24/24 1523  BP: 96/68 116/71  Pulse: 68 63  Resp: 20 19  Temp:    SpO2: 100% 100%    Last Pain:  Vitals:   03/24/24 1523  TempSrc:   PainSc: 0-No pain                 Lendia LITTIE Mae

## 2024-04-01 NOTE — Therapy (Signed)
 OUTPATIENT PHYSICAL THERAPY LOWER EXTREMITY TREATMENT     Patient Name: Jacob Davenport MRN: [id number] DOB:01-11-1951, 73 y.o., male Today's Date: 04/01/2024  END OF SESSION:  PT End of Session - 04/01/24 1609     Visit Number 3    Number of Visits 24    Date for PT Re-Evaluation 06/17/24    Authorization Type [employer] 2025    Authorization - Visit Number 3    Authorization - Number of Visits 24    Progress Note Due on Visit 10    PT Start Time 1605    PT Stop Time 1645    PT Time Calculation (min) 40 min    Activity Tolerance Patient tolerated treatment well    Behavior During Therapy WFL for tasks assessed/performed           Past Medical History:  Diagnosis Date   Adenomatous colon polyp    Allergic rhinitis    Alopecia    Anticoagulated on Coumadin    BPH (benign prostatic hyperplasia)    Chronic lower back pain    Coronary artery disease    Depression    Erectile dysfunction    Eye trauma    steel splinter    Genital herpes    GERD (gastroesophageal reflux disease)    Hypertension    Impaired fasting glucose    Pulmonary embolism, bilateral (HCC)    Superior mesenteric artery stenosis (HCC)    Past Surgical History:  Procedure Laterality Date   COLONOSCOPY     COLONOSCOPY N/A 03/24/2024   Procedure: COLONOSCOPY;  Surgeon: Toledo, Ladell POUR, MD;  Location: [facility];  Service: Gastroenterology;  Laterality: N/A;   COLONOSCOPY WITH PROPOFOL  N/A 05/31/2019   Procedure: COLONOSCOPY WITH PROPOFOL ;  Surgeon: Toledo, Ladell POUR, MD;  Location: [facility];  Service: Gastroenterology;  Laterality: N/A;   HEMORRHOID SURGERY     POLYPECTOMY  03/24/2024   Procedure: POLYPECTOMY, INTESTINE;  Surgeon: Toledo, Teodoro K, MD;  Location: [facility];  Service: Gastroenterology;;   There are no active problems to display for this patient.   PCP: Dr. Ike Lavender     REFERRING PROVIDER: Dr. 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 He needed to step over boxes and other objects in his garage. He is concerned about what he should and should not do in order to avoid ongoing knee pain. He has now been a lot more active since last session doing yard work and [employer].    PERTINENT HISTORY: Pt has been remodeling his home for the past several months. He started to experience pain when crawling in his attic especially in right knee and from standing for long periods of time. He mainly experiences the pain in the medial side of his right knee and he received imaging that show joint space narrowing along the medial compartment of the right knee and mainly when he is weight bearing. He has spent the past couple of weeks off of his feet to see if this makes a difference in improving his knee pain.    PAIN:  Are you having pain? Yes: NPRS scale: 2-3/10 NRPS at worst    Pain location: Medial side of patella on  right knee    Pain description: Sharp  Aggravating factors: Weight bearing especially standing for long periods of time or kneeling on his right knee  Relieving factors: Non-weight bearing like sitting and resting      PRECAUTIONS: None  RED FLAGS: None   WEIGHT BEARING RESTRICTIONS: No  FALLS:  Has patient fallen in last 6 months? No  LIVING ENVIRONMENT: Lives with: lives with their spouse Lives in: House/apartment Stairs: Yes: Internal: 13 steps; on right going up and External: 2 steps; none Has following equipment at home: Single point cane and Walker - 2 wheeled  OCCUPATION: Retired     PLOF: Independent  PATIENT GOALS: Pt wants to know what to do in order to get the knee to heal and wants to know what exercise to do in  order to avoid injury.   NEXT MD VISIT: 06/2024     OBJECTIVE:  Note: Objective measures were completed at Evaluation unless otherwise noted.  VITALS BP 124/73 HR 79 Sp02 99%  DIAGNOSTIC FINDINGS: [place]    PATIENT SURVEYS:  KOOS JR: 15/28 (50%)  COGNITION: Overall cognitive status: Within functional limits for tasks assessed     SENSATION: WFL  EDEMA:  Circumferential: R/L 15/14 3/4  MUSCLE LENGTH: Hamstrings: Right 90 deg; Left 90 deg Thomas test: NT  Ely's: + Bilaterally   POSTURE: rounded shoulders  PALPATION: TTP medial border of patella on right knee    LOWER EXTREMITY ROM:  Active ROM Right eval Left eval  Hip flexion    Hip extension    Hip abduction    Hip adduction    Hip internal rotation    Hip external rotation    Knee flexion 130 130  Knee extension 0 0  Ankle dorsiflexion    Ankle plantarflexion    Ankle inversion    Ankle eversion     (Blank rows = not tested)    LOWER EXTREMITY MMT:  MMT Right eval Left eval  Hip flexion 4 4  Hip extension 4- 4-  Hip abduction 4 4  Hip adduction 4- 4-  Hip internal rotation 4 4  Hip external rotation 4  4   Knee flexion 4 4  Knee extension 4 4  Ankle dorsiflexion 4+ 4+  Ankle plantarflexion    Ankle inversion    Ankle eversion     (Blank rows = not tested)         LOWER EXTREMITY SPECIAL TESTS:  Knee special tests: Patellafemoral apprehension test: NT, Lateral pull sign: NT, Patellafemoral grind test: NT, and Step up/down test: NT   FUNCTIONAL TESTS:  Squats: No knee varus and valgus and able to reach 90 deg hip and knee flexion   Step Over: Able to step over 6 inch step onto RLE and down with LLE; no knee varus or valgus   GAIT: Not performed  TREATMENT DATE:   04/01/24: THEREX    Matrix recumbent bicycle with seat at 12 for 5 min   Prone  Quad Stretch with red TB 2 x 60 sec    -p  SELF [employer] HOME MANAGEMENT   Decrease amount of flexion to avoid knee OA exacerbation  Use single point cane when stepping over objects and to avoid overloading right knee with weight bearing activity.   Pick hardest task to do at the beginning of the day to avoid doing later in day when pain sets in.  Education about how to perform transfer to floor using half kneel to quadruped   Recommendations:  1.) Avoid being on your feet all day and take seated rest breaks every 30 minutes for at 5 to 10 min   2.) Use knee pads when working on floor  3.) Do harder activities earlier on in the day to avoid being limited by pain  4.) Avoid knee flexion greater than 90 degrees.       PATIENT EDUCATION:  Education details: Form and technique for correct performance of exercise and explanation about underlying deficits.  Person educated: Patient Education method: Explanation, Demonstration, Verbal cues, and Handouts Education comprehension: verbalized understanding, returned demonstration, and verbal cues required  HOME EXERCISE PROGRAM: Access Code: [email or web address] URL: [email or web address] Date: 03/29/2024 Prepared by: Toribio Servant  Program Notes Seated knee bend 1 x per day 2-3 x per day for 60 sec   Exercises - Prone Quadriceps Stretch with Strap  - 1 x daily - 7 x weekly - 2-3 reps - 60 sec  hold - Squat with Chair Touch  - 3-4 x weekly - 3 sets - 10 reps - Standing Marching  - 3-4 x weekly - 3 sets - 10 reps   ASSESSMENT:  CLINICAL IMPRESSION: Sessions spend education patient about activity modification to prevent further exacerbation of right medial knee OA while still being able to complete his home improvement projects. Pt appears to ramping up his level of activity too quickly and without breaking up tasks, which is likely leading to further exacerbation of right knee OA. Pt able to demonstrate understanding about how to take  rest breaks throughout day and to utilize single point cane to avoid overloading right knee when stepping over objects in his garage. He will continue to benefit from skilled PT to address these aforementioned deficits to return to standing for prolonged periods of time, climbing ladders and stairs, and kneeling down to complete home improvement projects to maintain his home without being limited by pain and discomfort.     OBJECTIVE IMPAIRMENTS: decreased strength, increased edema, impaired flexibility, and pain.   ACTIVITY LIMITATIONS: carrying, lifting, bending, standing, stairs, toileting, and locomotion level  PARTICIPATION LIMITATIONS: cleaning, shopping, community activity, and yard work  PERSONAL FACTORS: Age, Time since onset of injury/illness/exacerbation, and 1-2 comorbidities: CAD, BPH, depression and h/o of pulmonary embolism are also affecting patient's functional outcome.   REHAB POTENTIAL: Good  CLINICAL DECISION MAKING: Stable/uncomplicated  EVALUATION COMPLEXITY: Low   GOALS: Goals reviewed with patient? No  SHORT TERM GOALS: Target date: 04/08/2024  Patient will demonstrate undestanding of home exercise plan by performing exercises correctly with evidence of good carry over with min to no verbal or tactile cues .   Baseline: NT  03/29/24: Performing exercises indepencently   Goal status: ACHIEVED     2.  Patient will demonstrate understanding of activity modifications while performing home improvement tasks to avoid exacerbating knee pain like sitting while doing  certain tasks to reduce incidence of provoking pain in his right knee.  Baseline: NT  Goal status: ONGOING      LONG TERM GOALS: Target date: 06/17/2024   Patient will demonstrate an improvement in right knee function as evidenced by a decrease in his KOOS Jr score by >=14 pts.  Baseline: 15/28 (50%) Goal status: ONGOING    2.  Patient will show an improvement in hip and knee strength by >=1/3 grade  MMT (ie 4- to 4) for improved knee stability to return to perform prolonged standing and kneeling activities without being limited by pain so he can complete home improvement projects. Baseline: Knee Ext R/L 4/4, Knee Flex R/L 4/4, Hip Flex R/L 4/4, Hip Abd R/L 4/4, Hip Ext R/L 4-/4-, Hip IR R/L 4/4, Hip ER R/L 4/4   Goal status: ONGOING    3.  Patient will show improve hip flexor flexibility with no hip raise on Ely's test for improved knee function to perform prolonged standing and kneeling activities without being limited by pain and discomfort. Baseline: + Bilateral  Goal status: ONGOING    4.  Patient will be able to perform weight bearing activities without exceeding right knee pain >=4/10 as evidence of improved knee function and to resume home improvement tasks. Baseline: 6-7/10 in medial pole of patella on right knee   Goal status: ONGOING     PLAN:  PT FREQUENCY: 1-2x/week  PT DURATION: 12 weeks  PLANNED INTERVENTIONS: 97164- PT Re-evaluation, 97750- Physical Performance Testing, 97110-Therapeutic exercises, 97530- Therapeutic activity, [id number]- Neuromuscular re-education, 97535- Self Care, 02859- Manual therapy, [id number]- Gait training, [phone]- Orthotic Initial, [phone]- Canalith repositioning, [id number]- Aquatic Therapy, [phone]- Electrical stimulation (unattended), [phone]- Electrical stimulation (manual), 20560 (1-2 muscles), 20561 (3+ muscles)- Dry Needling, Patient/Family education, Balance training, Stair training, Taping, Joint mobilization, Joint manipulation, Spinal manipulation, Spinal mobilization, Vestibular training, DME instructions, Cryotherapy, and Moist heat  PLAN FOR NEXT SESSION: SLR with external rot for VMO activation. OMEGA machines for LE strengthening  and gait analysis.   Toribio Servant PT, DPT  [facility] [address], [place], [address] Phone: [phone]   Fax:  [phone]

## 2024-04-05 ENCOUNTER — Encounter: Payer: Self-pay | Admitting: Physical Therapy

## 2024-04-05 ENCOUNTER — Ambulatory Visit: Admitting: Physical Therapy

## 2024-04-05 DIAGNOSIS — G8929 Other chronic pain: Secondary | ICD-10-CM

## 2024-04-05 DIAGNOSIS — M25561 Pain in right knee: Secondary | ICD-10-CM | POA: Diagnosis not present

## 2024-04-05 NOTE — Therapy (Signed)
 OUTPATIENT PHYSICAL THERAPY LOWER EXTREMITY TREATMENT     Patient Name: Jacob Davenport MRN: [id number] DOB:1951-06-27, 73 y.o., male Today's Date: 04/05/2024  END OF SESSION:  PT End of Session - 04/05/24 1034     Visit Number 4    Number of Visits 24    Date for PT Re-Evaluation 06/17/24    Authorization Type [employer] 2025    Authorization - Visit Number 4    Authorization - Number of Visits 24    Progress Note Due on Visit 10    PT Start Time 1032    PT Stop Time 1115    PT Time Calculation (min) 43 min    Activity Tolerance Patient tolerated treatment well    Behavior During Therapy WFL for tasks assessed/performed           Past Medical History:  Diagnosis Date   Adenomatous colon polyp    Allergic rhinitis    Alopecia    Anticoagulated on Coumadin    BPH (benign prostatic hyperplasia)    Chronic lower back pain    Coronary artery disease    Depression    Erectile dysfunction    Eye trauma    steel splinter    Genital herpes    GERD (gastroesophageal reflux disease)    Hypertension    Impaired fasting glucose    Pulmonary embolism, bilateral (HCC)    Superior mesenteric artery stenosis (HCC)    Past Surgical History:  Procedure Laterality Date   COLONOSCOPY     COLONOSCOPY N/A 03/24/2024   Procedure: COLONOSCOPY;  Surgeon: Toledo, Ladell POUR, MD;  Location: [facility];  Service: Gastroenterology;  Laterality: N/A;   COLONOSCOPY WITH PROPOFOL  N/A 05/31/2019   Procedure: COLONOSCOPY WITH PROPOFOL ;  Surgeon: Toledo, Ladell POUR, MD;  Location: [facility];  Service: Gastroenterology;  Laterality: N/A;   HEMORRHOID SURGERY     POLYPECTOMY  03/24/2024   Procedure: POLYPECTOMY, INTESTINE;  Surgeon: Toledo, Teodoro K, MD;  Location: [facility];  Service: Gastroenterology;;   There are no active problems to display for this patient.   PCP: Dr. Ike Lavender     REFERRING PROVIDER: Dr. 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 He did note swelling but this was towards the end of his day on Saturday.    PERTINENT HISTORY: Pt has been remodeling his home for the past several months. He started to experience pain when crawling in his attic especially in right knee and from standing for long periods of time. He mainly experiences the pain in the medial side of his right knee and he received imaging that show joint space narrowing along the medial compartment of the right knee and mainly when he is weight bearing. He has spent the past couple of weeks off of his feet to see if this makes a difference in improving his knee pain.    PAIN:  Are you having pain? Yes: NPRS scale: 2-3/10 NRPS at worst    Pain location: Medial side of patella on right knee    Pain description: Sharp  Aggravating factors: Weight bearing especially standing for long  periods of time or kneeling on his right knee  Relieving factors: Non-weight bearing like sitting and resting      PRECAUTIONS: None  RED FLAGS: None   WEIGHT BEARING RESTRICTIONS: No  FALLS:  Has patient fallen in last 6 months? No  LIVING ENVIRONMENT: Lives with: lives with their spouse Lives in: House/apartment Stairs: Yes: Internal: 13 steps; on right going up and External: 2 steps; none Has following equipment at home: Single point cane and Walker - 2 wheeled  OCCUPATION: Retired     PLOF: Independent  PATIENT GOALS: Pt wants to know what to do in order to get the knee to heal and wants to know what exercise to do in order to avoid injury.   NEXT MD VISIT: 06/2024     OBJECTIVE:  Note: Objective measures  were completed at Evaluation unless otherwise noted.  VITALS BP 124/73 HR 79 Sp02 99%  DIAGNOSTIC FINDINGS: [place]    PATIENT SURVEYS:  KOOS JR: 15/28 (50%)  COGNITION: Overall cognitive status: Within functional limits for tasks assessed     SENSATION: WFL  EDEMA:  Circumferential: R/L 15/14 3/4  MUSCLE LENGTH: Hamstrings: Right 90 deg; Left 90 deg Thomas test: NT  Ely's: + Bilaterally   POSTURE: rounded shoulders  PALPATION: TTP medial border of patella on right knee    LOWER EXTREMITY ROM:  Active ROM Right eval Left eval  Hip flexion    Hip extension    Hip abduction    Hip adduction    Hip internal rotation    Hip external rotation    Knee flexion 130 130  Knee extension 0 0  Ankle dorsiflexion    Ankle plantarflexion    Ankle inversion    Ankle eversion     (Blank rows = not tested)    LOWER EXTREMITY MMT:  MMT Right eval Left eval  Hip flexion 4 4  Hip extension 4- 4-  Hip abduction 4 4  Hip adduction 4- 4-  Hip internal rotation 4 4  Hip external rotation 4  4   Knee flexion 4 4  Knee extension 4 4  Ankle dorsiflexion 4+ 4+  Ankle plantarflexion    Ankle inversion    Ankle eversion     (Blank rows = not tested)         LOWER EXTREMITY SPECIAL TESTS:  Knee special tests: Patellafemoral apprehension test: NT, Lateral pull sign: NT, Patellafemoral grind test: NT, and Step up/down test: NT   FUNCTIONAL TESTS:  Squats: No knee varus and valgus and able to reach 90 deg hip and knee flexion   Step Over: Able to step over 6 inch step onto RLE and down with LLE; no knee varus or valgus   GAIT: Not performed  TREATMENT DATE:   04/05/24: THEREX    Matrix recumbent bicycle with seat at 12 for 5 min   Hip Abduction on left side 3 x 10    -mod VC to maintain an straight knee   -min TC to maintain  extended  Seated HS Stretch on RLE  2 x  60 sec   Seated Table Stretch on RLE  1 x 60 sec   -min VC to keep knee down   OMEGA HS Curl #RLE 2 x 10  -decrease speed of eccentric phase  Mini-Squat with green  band around knees 1 x 10   Gait Analysis: Slight inward rotation or valgus moment at knees in stance phase   Squats with green band around knees to 90-90 degrees hip and knee flexion  2 x 10  -min VC to maintain knee behind toes     PATIENT EDUCATION:  Education details: Form and technique for correct performance of exercise and explanation about underlying deficits.  Person educated: Patient Education method: Explanation, Demonstration, Verbal cues, and Handouts Education comprehension: verbalized understanding, returned demonstration, and verbal cues required  HOME EXERCISE PROGRAM: Access Code: [email or web address] URL: [email or web address] Date: 04/05/2024 Prepared by: Toribio Servant  Program Notes Seated knee bend 1 x per day 2-3 x per day for 60 sec   Exercises - Prone Quadriceps Stretch with Strap  - 1 x daily - 7 x weekly - 2-3 reps - 60 sec  hold - Seated Table Hamstring Stretch  - 1 x daily - 7 x weekly - 3 reps - 60 sec hold - Squat with Chair Touch and Resistance Loop  - 3-4 x weekly - 3 sets - 10 reps - Sidelying Hip Abduction  - 3-4 x weekly - 3 sets - 10 reps   ASSESSMENT:  CLINICAL IMPRESSION: Pt shows ongoing progress towards goals with ability to perform all weight bearing exercises without being limited by pain. Due to ongoing valgus moment of knees in weight bearing, PT focused exercises on strengthening hip external rotators to offload medial joint line of knees. He was limited in performing hip abduction by maintaining knee extension, but it is unclear whether this is due to hamstring tension or hip abductor weakness. He will continue to benefit from skilled PT to address these aforementioned deficits to return to standing for prolonged periods of time,  climbing ladders and stairs, and kneeling down to complete home improvement projects to maintain his home without being limited by pain and discomfort.    OBJECTIVE IMPAIRMENTS: decreased strength, increased edema, impaired flexibility, and pain.   ACTIVITY LIMITATIONS: carrying, lifting, bending, standing, stairs, toileting, and locomotion level  PARTICIPATION LIMITATIONS: cleaning, shopping, community activity, and yard work  PERSONAL FACTORS: Age, Time since onset of injury/illness/exacerbation, and 1-2 comorbidities: CAD, BPH, depression and h/o of pulmonary embolism are also affecting patient's functional outcome.   REHAB POTENTIAL: Good  CLINICAL DECISION MAKING: Stable/uncomplicated  EVALUATION COMPLEXITY: Low   GOALS: Goals reviewed with patient? No  SHORT TERM GOALS: Target date: 04/08/2024  Patient will demonstrate undestanding of home exercise plan by performing exercises correctly with evidence of good carry over with min to no verbal or tactile cues .   Baseline: NT  03/29/24: Performing exercises indepencently   Goal status: ACHIEVED     2.  Patient will demonstrate understanding of activity modifications while performing home improvement tasks to avoid exacerbating knee pain like sitting while doing certain tasks to reduce incidence of provoking pain in his right knee.  Baseline:  NT  Goal status: ONGOING      LONG TERM GOALS: Target date: 06/17/2024   Patient will demonstrate an improvement in right knee function as evidenced by a decrease in his KOOS Jr score by >=14 pts.  Baseline: 15/28 (50%) Goal status: ONGOING    2.  Patient will show an improvement in hip and knee strength by >=1/3 grade MMT (ie 4- to 4) for improved knee stability to return to perform prolonged standing and kneeling activities without being limited by pain so he can complete home improvement projects. Baseline: Knee Ext R/L 4/4, Knee Flex R/L 4/4, Hip Flex R/L 4/4, Hip Abd R/L 4/4, Hip Ext  R/L 4-/4-, Hip IR R/L 4/4, Hip ER R/L 4/4   Goal status: ONGOING    3.  Patient will show improve hip flexor flexibility with no hip raise on Ely's test for improved knee function to perform prolonged standing and kneeling activities without being limited by pain and discomfort. Baseline: + Bilateral  Goal status: ONGOING    4.  Patient will be able to perform weight bearing activities without exceeding right knee pain >=4/10 as evidence of improved knee function and to resume home improvement tasks. Baseline: 6-7/10 in medial pole of patella on right knee   Goal status: ONGOING     PLAN:  PT FREQUENCY: 1-2x/week  PT DURATION: 12 weeks  PLANNED INTERVENTIONS: 97164- PT Re-evaluation, 97750- Physical Performance Testing, 97110-Therapeutic exercises, 97530- Therapeutic activity, [id number]- Neuromuscular re-education, 97535- Self Care, 02859- Manual therapy, [id number]- Gait training, [phone]- Orthotic Initial, [phone]- Canalith repositioning, [phone]- Aquatic Therapy, [phone]- Electrical stimulation (unattended), [phone]- Electrical stimulation (manual), 20560 (1-2 muscles), 20561 (3+ muscles)- Dry Needling, Patient/Family education, Balance training, Stair training, Taping, Joint mobilization, Joint manipulation, Spinal manipulation, Spinal mobilization, Vestibular training, DME instructions, Cryotherapy, and Moist heat  PLAN FOR NEXT SESSION:  Continue to strengthen hip abductors: lateral step ups, Matrix resisted hip abduction and adduction.   Toribio Servant PT, DPT  [facility] [address] [place], [address] Phone: [phone]   Fax:  [phone]

## 2024-04-07 ENCOUNTER — Ambulatory Visit: Admitting: Physical Therapy

## 2024-04-07 DIAGNOSIS — G8929 Other chronic pain: Secondary | ICD-10-CM

## 2024-04-07 DIAGNOSIS — M25561 Pain in right knee: Secondary | ICD-10-CM | POA: Diagnosis not present

## 2024-04-07 NOTE — Therapy (Signed)
 OUTPATIENT PHYSICAL THERAPY LOWER EXTREMITY TREATMENT     Patient Name: Jacob Davenport MRN: [id number] DOB:06/02/1951, 73 y.o., male Today's Date: 04/07/2024  END OF SESSION:  PT End of Session - 04/07/24 1124     Visit Number 5    Number of Visits 24    Date for PT Re-Evaluation 06/17/24    Authorization Type [employer] 2025    Authorization - Visit Number 5    Authorization - Number of Visits 24    Progress Note Due on Visit 10    PT Start Time 1120    PT Stop Time 1200    PT Time Calculation (min) 40 min    Activity Tolerance Patient tolerated treatment well    Behavior During Therapy WFL for tasks assessed/performed            Past Medical History:  Diagnosis Date   Adenomatous colon polyp    Allergic rhinitis    Alopecia    Anticoagulated on Coumadin    BPH (benign prostatic hyperplasia)    Chronic lower back pain    Coronary artery disease    Depression    Erectile dysfunction    Eye trauma    steel splinter    Genital herpes    GERD (gastroesophageal reflux disease)    Hypertension    Impaired fasting glucose    Pulmonary embolism, bilateral (HCC)    Superior mesenteric artery stenosis (HCC)    Past Surgical History:  Procedure Laterality Date   COLONOSCOPY     COLONOSCOPY N/A 03/24/2024   Procedure: COLONOSCOPY;  Surgeon: Toledo, Ladell POUR, MD;  Location: [facility];  Service: Gastroenterology;  Laterality: N/A;   COLONOSCOPY WITH PROPOFOL  N/A 05/31/2019   Procedure: COLONOSCOPY WITH PROPOFOL ;  Surgeon: Toledo, Ladell POUR, MD;  Location: [facility];  Service: Gastroenterology;  Laterality: N/A;   HEMORRHOID SURGERY     POLYPECTOMY  03/24/2024   Procedure: POLYPECTOMY, INTESTINE;  Surgeon: Toledo, Teodoro K, MD;  Location: [facility];  Service: Gastroenterology;;   There are no active problems to display for this patient.   PCP: Dr. Ike Lavender     REFERRING PROVIDER: Dr. Ike Lavender   REFERRING DIAG: [place]om[place]ment  Arthritis in right knee     THERAPY DIAG:  Chronic pain of right knee  Rationale for Evaluation and Treatment: Rehabilitation  ONSET DATE: January 2025     SUBJECTIVE:   SUBJECTIVE STATEMENT: Pt states that his right knee has been feeling much better to the point where he was able to stand on his feet for 2.5 hours straight.    PERTINENT HISTORY: Pt has been remodeling his home for the past several months. He started to experience pain when crawling in his attic especially in right knee and from standing for long periods of time. He mainly experiences the pain in the medial side of his right knee and he received imaging that show joint space narrowing along the medial compartment of the right knee and mainly when he is weight bearing. He has spent the past couple of weeks off of his feet to see if this makes a difference in improving his knee pain.    PAIN:  Are you having pain? Yes: NPRS scale: 2-3/10 NRPS at worst    Pain location: Medial side of patella on right knee    Pain description: Sharp  Aggravating factors: Weight bearing especially standing for long periods of time or kneeling on his right knee  Relieving factors: Non-weight bearing like sitting  and resting      PRECAUTIONS: None  RED FLAGS: None   WEIGHT BEARING RESTRICTIONS: No  FALLS:  Has patient fallen in last 6 months? No  LIVING ENVIRONMENT: Lives with: lives with their spouse Lives in: House/apartment Stairs: Yes: Internal: 13 steps; on right going up and External: 2 steps; none Has following equipment at home: Single point cane and Walker - 2 wheeled  OCCUPATION: Retired     PLOF: Independent  PATIENT GOALS: Pt wants to know what to do in order to get the knee to heal and wants to know what exercise to do in order to avoid injury.   NEXT MD VISIT: 06/2024     OBJECTIVE:  Note: Objective measures were completed at Evaluation unless otherwise noted.  VITALS BP 124/73 HR 79 Sp02 99%  DIAGNOSTIC  FINDINGS: [place]    PATIENT SURVEYS:  KOOS JR: 15/28 (50%)  COGNITION: Overall cognitive status: Within functional limits for tasks assessed     SENSATION: WFL  EDEMA:  Circumferential: R/L 15/14 3/4  MUSCLE LENGTH: Hamstrings: Right 90 deg; Left 90 deg Thomas test: NT  Ely's: + Bilaterally   POSTURE: rounded shoulders  PALPATION: TTP medial border of patella on right knee    LOWER EXTREMITY ROM:  Active ROM Right eval Left eval  Hip flexion    Hip extension    Hip abduction    Hip adduction    Hip internal rotation    Hip external rotation    Knee flexion 130 130  Knee extension 0 0  Ankle dorsiflexion    Ankle plantarflexion    Ankle inversion    Ankle eversion     (Blank rows = not tested)    LOWER EXTREMITY MMT:  MMT Right eval Left eval  Hip flexion 4 4  Hip extension 4- 4-  Hip abduction 4 4  Hip adduction 4- 4-  Hip internal rotation 4 4  Hip external rotation 4  4   Knee flexion 4 4  Knee extension 4 4  Ankle dorsiflexion 4+ 4+  Ankle plantarflexion    Ankle inversion    Ankle eversion     (Blank rows = not tested)         LOWER EXTREMITY SPECIAL TESTS:  Knee special tests: Patellafemoral apprehension test: NT, Lateral pull sign: NT, Patellafemoral grind test: NT, and Step up/down test: NT   FUNCTIONAL TESTS:  Squats: No knee varus and valgus and able to reach 90 deg hip and knee flexion   Step Over: Able to step over 6 inch step onto RLE and down with LLE; no knee varus or valgus   GAIT: Not performed                                                                                                                                  TREATMENT DATE:   04/07/24: THEREX    Matrix recumbent bicycle with seat at  9 for 5 min and resistance at 3  Resisted lateral lunges with green band 30 ft  x 2   -Pt reports increased pain in medial side of right knee  Resisted lateral lunges with green band around hips directly above knees  30  ft x 4    Matrix 4-settings at 3 for rotary arm and vertical arm at 7 - Way with hip adductor resistance  #40 on RLE with BUE support 1 x 10   Matrix 4 Way with hip adductor resistance  #55  on BUE support on RLE 1 x 10   -min VC to decrease speed of eccentric portion of exercise   Matrix 4 Way with hip adductor resistance  #70 on BUE support on LLE 2 x 10  Side Lying Hip Adduction on RLE AROM 1 x 10  Side Lying Hip Adduction on RLE #1 AW  2 x 10      PATIENT EDUCATION:  Education details: Form and technique for correct performance of exercise and explanation about underlying deficits.  Person educated: Patient Education method: Explanation, Demonstration, Verbal cues, and Handouts Education comprehension: verbalized understanding, returned demonstration, and verbal cues required  HOME EXERCISE PROGRAM: Access Code: [email or web address] URL: [email or web address] Date: 04/07/2024 Prepared by: Toribio Servant  Program Notes Seated knee bend 1 x per day 2-3 x per day for 60 sec   Exercises - Prone Quadriceps Stretch with Strap  - 1 x daily - 7 x weekly - 2-3 reps - 60 sec  hold - Seated Table Hamstring Stretch  - 1 x daily - 7 x weekly - 3 reps - 60 sec hold - Squat with Chair Touch and Resistance Loop  - 3-4 x weekly - 3 sets - 10 reps - Side Stepping with Resistance at Thighs  - 3-4 x weekly - 3 sets - 10 reps - Sidelying Hip Adduction  - 3-4 x weekly - 3 sets - 10 reps  ASSESSMENT:  CLINICAL IMPRESSION: Pt shows ongoing improvement with knee and hip strength and decreased pain with activity as evidenced by ability to complete exercises with increased resistance. He did show increased knee external rotation during squats, so focus of session continued to be on strengthening hip ER. He will continue to benefit from skilled PT to address these aforementioned deficits to return to standing for prolonged periods of time, climbing ladders and stairs, and kneeling down to complete home  improvement projects to maintain his home without being limited by pain and discomfort.    OBJECTIVE IMPAIRMENTS: decreased strength, increased edema, impaired flexibility, and pain.   ACTIVITY LIMITATIONS: carrying, lifting, bending, standing, stairs, toileting, and locomotion level  PARTICIPATION LIMITATIONS: cleaning, shopping, community activity, and yard work  PERSONAL FACTORS: Age, Time since onset of injury/illness/exacerbation, and 1-2 comorbidities: CAD, BPH, depression and h/o of pulmonary embolism are also affecting patient's functional outcome.   REHAB POTENTIAL: Good  CLINICAL DECISION MAKING: Stable/uncomplicated  EVALUATION COMPLEXITY: Low   GOALS: Goals reviewed with patient? No  SHORT TERM GOALS: Target date: 04/08/2024  Patient will demonstrate undestanding of home exercise plan by performing exercises correctly with evidence of good carry over with min to no verbal or tactile cues .   Baseline: NT  03/29/24: Performing exercises indepencently   Goal status: ACHIEVED     2.  Patient will demonstrate understanding of activity modifications while performing home improvement tasks to avoid exacerbating knee pain like sitting while doing certain tasks to reduce incidence of provoking pain in his right knee.  Baseline: NT 04/07/24: Able to modify activity accordingly to avoid exacerbation of his right knee pain  Goal status: ACHIEVED     LONG TERM GOALS: Target date: 06/17/2024   Patient will demonstrate an improvement in right knee function as evidenced by a decrease in his KOOS Jr score by >=14 pts.  Baseline: 15/28 (50%) Goal status: ONGOING    2.  Patient will show an improvement in hip and knee strength by >=1/3 grade MMT (ie 4- to 4) for improved knee stability to return to perform prolonged standing and kneeling activities without being limited by pain so he can complete home improvement projects. Baseline: Knee Ext R/L 4/4, Knee Flex R/L 4/4, Hip Flex R/L  4/4, Hip Abd R/L 4/4, Hip Ext R/L 4-/4-, Hip IR R/L 4/4, Hip ER R/L 4/4   Goal status: ONGOING    3.  Patient will show improve hip flexor flexibility with no hip raise on Ely's test for improved knee function to perform prolonged standing and kneeling activities without being limited by pain and discomfort. Baseline: + Bilateral  Goal status: ONGOING    4.  Patient will be able to perform weight bearing activities without exceeding right knee pain >=4/10 as evidence of improved knee function and to resume home improvement tasks. Baseline: 6-7/10 in medial pole of patella on right knee   Goal status: ONGOING     PLAN:  PT FREQUENCY: 1-2x/week  PT DURATION: 12 weeks  PLANNED INTERVENTIONS: 97164- PT Re-evaluation, 97750- Physical Performance Testing, 97110-Therapeutic exercises, 97530- Therapeutic activity, [id number]- Neuromuscular re-education, 97535- Self Care, 02859- Manual therapy, [id number]- Gait training, [phone]- Orthotic Initial, [phone]- Canalith repositioning, [phone]- Aquatic Therapy, [phone]- Electrical stimulation (unattended), [phone]- Electrical stimulation (manual), 20560 (1-2 muscles), 20561 (3+ muscles)- Dry Needling, Patient/Family education, Balance training, Stair training, Taping, Joint mobilization, Joint manipulation, Spinal manipulation, Spinal mobilization, Vestibular training, DME instructions, Cryotherapy, and Moist heat  PLAN FOR NEXT SESSION: Quarter squats, OMEGA HS curls, OMEGA Knee Ext, standing heel raises. Reassess long term goals.   Toribio Servant PT, DPT  [facility] [address], [place], [address] Phone: [phone]   Fax:  [phone]

## 2024-04-14 ENCOUNTER — Ambulatory Visit: Attending: Family Medicine | Admitting: Physical Therapy

## 2024-04-14 DIAGNOSIS — M25561 Pain in right knee: Secondary | ICD-10-CM | POA: Diagnosis present

## 2024-04-14 DIAGNOSIS — G8929 Other chronic pain: Secondary | ICD-10-CM | POA: Insufficient documentation

## 2024-04-14 NOTE — Therapy (Signed)
 OUTPATIENT PHYSICAL THERAPY LOWER EXTREMITY TREATMENT     Patient Name: Jacob Davenport MRN: [id number] DOB:1950-08-22, 73 y.o., male Today's Date: 04/14/2024  END OF SESSION:  PT End of Session - 04/14/24 1356     Visit Number 6    Number of Visits 24    Date for PT Re-Evaluation 06/17/24    Authorization Type [employer] 2025    Authorization - Visit Number 6    Authorization - Number of Visits 24    Progress Note Due on Visit 10    PT Start Time 1345    PT Stop Time 1430    PT Time Calculation (min) 45 min    Activity Tolerance Patient tolerated treatment well    Behavior During Therapy WFL for tasks assessed/performed             Past Medical History:  Diagnosis Date   Adenomatous colon polyp    Allergic rhinitis    Alopecia    Anticoagulated on Coumadin    BPH (benign prostatic hyperplasia)    Chronic lower back pain    Coronary artery disease    Depression    Erectile dysfunction    Eye trauma    steel splinter    Genital herpes    GERD (gastroesophageal reflux disease)    Hypertension    Impaired fasting glucose    Pulmonary embolism, bilateral (HCC)    Superior mesenteric artery stenosis (HCC)    Past Surgical History:  Procedure Laterality Date   COLONOSCOPY     COLONOSCOPY N/A 03/24/2024   Procedure: COLONOSCOPY;  Surgeon: Toledo, Ladell POUR, MD;  Location: [facility];  Service: Gastroenterology;  Laterality: N/A;   COLONOSCOPY WITH PROPOFOL  N/A 05/31/2019   Procedure: COLONOSCOPY WITH PROPOFOL ;  Surgeon: Toledo, Ladell POUR, MD;  Location: [facility];  Service: Gastroenterology;  Laterality: N/A;   HEMORRHOID SURGERY     POLYPECTOMY  03/24/2024   Procedure: POLYPECTOMY, INTESTINE;  Surgeon: Toledo, Teodoro K, MD;  Location: [facility];  Service: Gastroenterology;;   There are no active problems to display for this patient.   PCP: Dr. Ike Lavender     REFERRING PROVIDER: Dr. Ike Lavender   REFERRING DIAG: [place]  Arthritis in right knee     THERAPY DIAG:  Chronic pain of right knee  Rationale for Evaluation and Treatment: Rehabilitation  ONSET DATE: January 2025     SUBJECTIVE:   SUBJECTIVE STATEMENT: Pt states that his medial right knee pain tends to come and go with him experiencing increased pain after waking up in the morning and towards the end of the day. He typically feels a relief in his pain when taking a seated rest break.   PERTINENT HISTORY: Pt has been remodeling his home for the past several months. He started to experience pain when crawling in his attic especially in right knee and from standing for long periods of time. He mainly experiences the pain in the medial side of his right knee and he received imaging that show joint space narrowing along the medial compartment of the right knee and mainly when he is weight bearing. He has spent the past couple of weeks off of his feet to see if this makes a difference in improving his knee pain.    PAIN:  Are you having pain? Yes: NPRS scale: 1-2/10 NRPS at worst    Pain location: Medial side of patella on right knee    Pain description: Sharp  Aggravating factors: Weight bearing especially standing  for long periods of time or kneeling on his right knee  Relieving factors: Non-weight bearing like sitting and resting      PRECAUTIONS: None  RED FLAGS: None   WEIGHT BEARING RESTRICTIONS: No  FALLS:  Has patient fallen in last 6 months? No  LIVING ENVIRONMENT: Lives with: lives with their spouse Lives in: House/apartment Stairs: Yes: Internal: 13 steps; on right going up and External: 2 steps; none Has following equipment at home: Single point cane and Walker - 2 wheeled  OCCUPATION: Retired     PLOF: Independent  PATIENT GOALS: Pt wants to know what to do in order to get the knee to heal and wants to know what exercise to do in order to avoid injury.   NEXT MD VISIT: 06/2024     OBJECTIVE:  Note: Objective measures  were completed at Evaluation unless otherwise noted.  VITALS BP 124/73 HR 79 Sp02 99%  DIAGNOSTIC FINDINGS: [place]    PATIENT SURVEYS:  KOOS JR: 15/28 (50%)  COGNITION: Overall cognitive status: Within functional limits for tasks assessed     SENSATION: WFL  EDEMA:  Circumferential: R/L 15/14 3/4  MUSCLE LENGTH: Hamstrings: Right 90 deg; Left 90 deg Thomas test: NT  Ely's: + Bilaterally   POSTURE: rounded shoulders  PALPATION: TTP medial border of patella on right knee    LOWER EXTREMITY ROM:  Active ROM Right eval Left eval  Hip flexion    Hip extension    Hip abduction    Hip adduction    Hip internal rotation    Hip external rotation    Knee flexion 130 130  Knee extension 0 0  Ankle dorsiflexion    Ankle plantarflexion    Ankle inversion    Ankle eversion     (Blank rows = not tested)    LOWER EXTREMITY MMT:  MMT Right eval Left eval  Hip flexion 4 4  Hip extension 4- 4-  Hip abduction 4 4  Hip adduction 4- 4-  Hip internal rotation 4 4  Hip external rotation 4  4   Knee flexion 4 4  Knee extension 4 4  Ankle dorsiflexion 4+ 4+  Ankle plantarflexion    Ankle inversion    Ankle eversion     (Blank rows = not tested)         LOWER EXTREMITY SPECIAL TESTS:  Knee special tests: Patellafemoral apprehension test: NT, Lateral pull sign: NT, Patellafemoral grind test: NT, and Step up/down test: NT   FUNCTIONAL TESTS:  Squats: No knee varus and valgus and able to reach 90 deg hip and knee flexion   Step Over: Able to step over 6 inch step onto RLE and down with LLE; no knee varus or valgus   GAIT: Not performed  TREATMENT DATE:   04/14/24: THERAC  Matrix recumbent bicycle with seat at 9 for 20 min and resistance at 3  -HR at 111 for majority of duration of stationary bike (well within vigorous intensity  range) 220-74=  146  HR Max   -Aim for 40-60% of HR max= 58- 88 HR for moderate intensity   -Aim for 70-80% of HR max=  102-117 HR for vigorous intensity     SE[employer] HOME MANAGEMENT  Educated on rate of perceived exertion and use of song and talk test to gauge intensity and to use HR max technique if he had way to monitor heart rate.  Provided patient with handout that illustrated parameters for gauging exercise intensity and it provided patient with recommendation for 150 min of moderate intensity exercises and 75 min vigorous intensity.    THEREX   Overground ambulation 30 ft x 4  with #3 AW  -no balance or significant gait disturbances   Standing Heel Raises with #3 AW 1 x 10  Standing Heel Raises with #3 AW on 6 inch step 2 x 10         PATIENT EDUCATION:  Education details: Form and technique for correct performance of exercise and explanation about underlying deficits.  Person educated: Patient Education method: Explanation, Demonstration, Verbal cues, and Handouts Education comprehension: verbalized understanding, returned demonstration, and verbal cues required  HOME EXERCISE PROGRAM: Access Code: [email or web address] URL: [email or web address] Date: 04/07/2024 Prepared by: Toribio Servant  Program Notes Seated knee bend 1 x per day 2-3 x per day for 60 sec   Exercises - Prone Quadriceps Stretch with Strap  - 1 x daily - 7 x weekly - 2-3 reps - 60 sec  hold - Seated Table Hamstring Stretch  - 1 x daily - 7 x weekly - 3 reps - 60 sec hold - Squat with Chair Touch and Resistance Loop  - 3-4 x weekly - 3 sets - 10 reps - Side Stepping with Resistance at Thighs  - 3-4 x weekly - 3 sets - 10 reps - Sidelying Hip Adduction  - 3-4 x weekly - 3 sets - 10 reps  ASSESSMENT:  CLINICAL IMPRESSION: Pt continues to progress towards goals with decreased right knee pain even with increased activity and increased LE strength with ability to perform exercises with increased  resistance. He was able to demonstrate understanding of how to gauge exercise intensity using song test and percentage of HR max technique and he was able to reach vigorous intensity level without being limited by right knee pain. PT to decrease frequency of exercise to 1x per week due to pt being able to perform exercises independently and with his pain remaining relatively low. He will continue to benefit from skilled PT to address these aforementioned deficits to return to standing for prolonged periods of time, climbing ladders and stairs, and kneeling down to complete home improvement projects to maintain his home without being limited by pain and discomfort.  OBJECTIVE IMPAIRMENTS: decreased strength, increased edema, impaired flexibility, and pain.   ACTIVITY LIMITATIONS: carrying, lifting, bending, standing, stairs, toileting, and locomotion level  PARTICIPATION LIMITATIONS: cleaning, shopping, community activity, and yard work  PERSONAL FACTORS: Age, Time since onset of injury/illness/exacerbation, and 1-2 comorbidities: CAD, BPH, depression and h/o of pulmonary embolism are also affecting patient's functional outcome.   REHAB POTENTIAL: Good  CLINICAL DECISION MAKING: Stable/uncomplicated  EVALUATION COMPLEXITY: Low   GOALS: Goals reviewed with patient? No  SHORT TERM GOALS: Target date: 04/08/2024  Patient  will demonstrate undestanding of home exercise plan by performing exercises correctly with evidence of good carry over with min to no verbal or tactile cues .   Baseline: NT  03/29/24: Performing exercises indepencently   Goal status: ACHIEVED     2.  Patient will demonstrate understanding of activity modifications while performing home improvement tasks to avoid exacerbating knee pain like sitting while doing certain tasks to reduce incidence of provoking pain in his right knee.  Baseline: NT 04/07/24: Able to modify activity accordingly to avoid exacerbation of his right knee  pain  Goal status: ACHIEVED     LONG TERM GOALS: Target date: 06/17/2024   Patient will demonstrate an improvement in right knee function as evidenced by a decrease in his KOOS Jr score by >=14 pts.  Baseline: 15/28 (50%) Goal status: ONGOING    2.  Patient will show an improvement in hip and knee strength by >=1/3 grade MMT (ie 4- to 4) for improved knee stability to return to perform prolonged standing and kneeling activities without being limited by pain so he can complete home improvement projects. Baseline: Knee Ext R/L 4/4, Knee Flex R/L 4/4, Hip Flex R/L 4/4, Hip Abd R/L 4/4, Hip Ext R/L 4-/4-, Hip IR R/L 4/4, Hip ER R/L 4/4   Goal status: ONGOING    3.  Patient will show improve hip flexor flexibility with no hip raise on Ely's test for improved knee function to perform prolonged standing and kneeling activities without being limited by pain and discomfort. Baseline: + Bilateral  Goal status: ONGOING    4.  Patient will be able to perform weight bearing activities without exceeding right knee pain >=4/10 as evidence of improved knee function and to resume home improvement tasks. Baseline: 6-7/10 in medial pole of patella on right knee   Goal status: ONGOING     PLAN:  PT FREQUENCY: 1-2x/week  PT DURATION: 12 weeks  PLANNED INTERVENTIONS: 97164- PT Re-evaluation, 97750- Physical Performance Testing, 97110-Therapeutic exercises, 97530- Therapeutic activity, [id number]- Neuromuscular re-education, 97535- Self Care, 02859- Manual therapy, [id number]- Gait training, [id number]- Orthotic Initial, [phone]- Canalith repositioning, [id number]- Aquatic Therapy, [phone]- Electrical stimulation (unattended), [phone]- Electrical stimulation (manual), 20560 (1-2 muscles), 20561 (3+ muscles)- Dry Needling, Patient/Family education, Balance training, Stair training, Taping, Joint mobilization, Joint manipulation, Spinal manipulation, Spinal mobilization, Vestibular training, DME instructions, Cryotherapy, and Moist  heat  PLAN FOR NEXT SESSION: Reassess long term goals. OMEGA HS curls, OMEGA Knee Ext, and OMEGA Leg Press    Toribio Servant PT, DPT  [facility] [address], [place], [address] Phone: [phone]   Fax:  [phone]

## 2024-04-19 ENCOUNTER — Ambulatory Visit: Admitting: Physical Therapy

## 2024-04-19 DIAGNOSIS — M25561 Pain in right knee: Secondary | ICD-10-CM | POA: Diagnosis not present

## 2024-04-19 DIAGNOSIS — G8929 Other chronic pain: Secondary | ICD-10-CM

## 2024-04-19 NOTE — Therapy (Signed)
 OUTPATIENT PHYSICAL THERAPY LOWER EXTREMITY TREATMENT     Patient Name: Jacob Davenport MRN: [id number] DOB:1951-04-28, 73 y.o., male Today's Date: 04/19/2024  END OF SESSION:  PT End of Session - 04/19/24 1707     Visit Number 7    Number of Visits 24    Date for PT Re-Evaluation 06/17/24    Authorization Type [employer] 2025    Authorization - Visit Number 7    Authorization - Number of Visits 24    Progress Note Due on Visit 10    PT Start Time 1515    PT Stop Time 1600    PT Time Calculation (min) 45 min    Activity Tolerance Patient tolerated treatment well    Behavior During Therapy WFL for tasks assessed/performed              Past Medical History:  Diagnosis Date   Adenomatous colon polyp    Allergic rhinitis    Alopecia    Anticoagulated on Coumadin    BPH (benign prostatic hyperplasia)    Chronic lower back pain    Coronary artery disease    Depression    Erectile dysfunction    Eye trauma    steel splinter    Genital herpes    GERD (gastroesophageal reflux disease)    Hypertension    Impaired fasting glucose    Pulmonary embolism, bilateral (HCC)    Superior mesenteric artery stenosis (HCC)    Past Surgical History:  Procedure Laterality Date   COLONOSCOPY     COLONOSCOPY N/A 03/24/2024   Procedure: COLONOSCOPY;  Surgeon: Toledo, Ladell POUR, MD;  Location: [facility];  Service: Gastroenterology;  Laterality: N/A;   COLONOSCOPY WITH PROPOFOL  N/A 05/31/2019   Procedure: COLONOSCOPY WITH PROPOFOL ;  Surgeon: Toledo, Ladell POUR, MD;  Location: [facility];  Service: Gastroenterology;  Laterality: N/A;   HEMORRHOID SURGERY     POLYPECTOMY  03/24/2024   Procedure: POLYPECTOMY, INTESTINE;  Surgeon: Toledo, Teodoro K, MD;  Location: [facility];  Service: Gastroenterology;;   There are no active problems to display for this patient.   PCP: Dr. Ike Lavender     REFERRING PROVIDER: Dr. 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  Rationale for Evaluation and Treatment: Rehabilitation  ONSET DATE: January 2025     SUBJECTIVE:   SUBJECTIVE STATEMENT: Pt reports ongoing improvement in right knee pain and he is now doing most of activities including home improvement projects at his prior level of function. He was able to knee on right knee for several   PERTINENT HISTORY: Pt has been remodeling his home for the past several months. He started to experience pain when crawling in his attic especially in right knee and from standing for long periods of time. He mainly experiences the pain in the medial side of his right knee and he received imaging that show joint space narrowing along the medial compartment of the right knee and mainly when he is weight bearing. He has spent the past couple of weeks off of his feet to see if this makes a difference in improving his knee pain.    PAIN:  Are you having pain? Yes: NPRS scale: 1-2/10 NRPS at worst    Pain location: Medial side of patella on right knee    Pain description: Sharp  Aggravating factors: Weight bearing especially standing for long periods of time or kneeling on his right  knee  Relieving factors: Non-weight bearing like sitting and resting      PRECAUTIONS: None  RED FLAGS: None   WEIGHT BEARING RESTRICTIONS: No  FALLS:  Has patient fallen in last 6 months? No  LIVING ENVIRONMENT: Lives with: lives with their spouse Lives in: House/apartment Stairs: Yes: Internal: 13 steps; on right going up and External: 2 steps; none Has following equipment at home: Single point cane and Walker - 2 wheeled  OCCUPATION: Retired     PLOF: Independent  PATIENT GOALS: Pt wants to know what to do in order to get the knee to heal and wants to know what exercise to do in order to avoid injury.   NEXT MD VISIT: 06/2024     OBJECTIVE:  Note: Objective measures were completed at Evaluation unless  otherwise noted.  VITALS BP 124/73 HR 79 Sp02 99%  DIAGNOSTIC FINDINGS: [place]    PATIENT SURVEYS:  KOOS JR: 15/28 (50%)  COGNITION: Overall cognitive status: Within functional limits for tasks assessed     SENSATION: WFL  EDEMA:  Circumferential: R/L 15/14 3/4  MUSCLE LENGTH: Hamstrings: Right 90 deg; Left 90 deg Thomas test: NT  Ely's: + Bilaterally   POSTURE: rounded shoulders  PALPATION: TTP medial border of patella on right knee    LOWER EXTREMITY ROM:  Active ROM Right eval Left eval  Hip flexion    Hip extension    Hip abduction    Hip adduction    Hip internal rotation    Hip external rotation    Knee flexion 130 130  Knee extension 0 0  Ankle dorsiflexion    Ankle plantarflexion    Ankle inversion    Ankle eversion     (Blank rows = not tested)    LOWER EXTREMITY MMT:  MMT Right eval Left eval Right  04/19/24   Left  04/19/24  Hip flexion 4 4 4+ 4+  Hip extension 4- 4- 4 4+  Hip abduction 4 4 4+ 4+  Hip adduction 4- 4- NT  NT     Hip internal rotation 4 4 4+ 4+  Hip external rotation 4  4  4 4   Knee flexion 4 4 4+ 4+  Knee extension 4 4 4+ 4+  Ankle dorsiflexion 4+ 4+    Ankle plantarflexion      Ankle inversion      Ankle eversion       (Blank rows = not tested)         LOWER EXTREMITY SPECIAL TESTS:  Knee special tests: Patellafemoral apprehension test: NT, Lateral pull sign: NT, Patellafemoral grind test: NT, and Step up/down test: NT   FUNCTIONAL TESTS:  Squats: No knee varus and valgus and able to reach 90 deg hip and knee flexion   Step Over: Able to step over 6 inch step onto RLE and down with LLE; no knee varus or valgus   GAIT: Not performed  TREATMENT DATE:   04/19/24:  THEREX   KOOS JR  (3/28 or 80%) Knee and Hip MMT  (See above) Banded Squat with Blue Band to 22 inch mat height 2  x 10  Side Stepping 10 ft x 10 with blue band      PATIENT EDUCATION:  Education details: Form and technique for correct performance of exercise and explanation about underlying deficits.  Person educated: Patient Education method: Explanation, Demonstration, Verbal cues, and Handouts Education comprehension: verbalized understanding, returned demonstration, and verbal cues required  HOME EXERCISE PROGRAM: Access Code: [email or web address] URL: [email or web address] Date: 04/07/2024 Prepared by: Toribio Servant  Program Notes Seated knee bend 1 x per day 2-3 x per day for 60 sec   Exercises - Prone Quadriceps Stretch with Strap  - 1 x daily - 7 x weekly - 2-3 reps - 60 sec  hold - Seated Table Hamstring Stretch  - 1 x daily - 7 x weekly - 3 reps - 60 sec hold - Squat with Chair Touch and Resistance Loop  - 3-4 x weekly - 3 sets - 10 reps - Side Stepping with Resistance at Thighs  - 3-4 x weekly - 3 sets - 10 reps - Sidelying Hip Adduction  - 3-4 x weekly - 3 sets - 10 reps  ASSESSMENT:  CLINICAL IMPRESSION: Pt has now made consistent progress towards goals with ongoing decrease in right knee pain along with improvement in right hip strength and perception of right knee function. Due to his ongoing progress, PT has reduced frequency to 1x per week and if he continues to do well, then he will be discharged next session. Home exercise plan progressed to include increased resistance with hip ER and hip extension which were the two remaining areas of strength deficits. He will continue to benefit from skilled PT to address these aforementioned deficits to return to standing for prolonged periods of time, climbing ladders and stairs, and kneeling down to complete home improvement projects to maintain his home without being limited by pain and discomfort.   OBJECTIVE IMPAIRMENTS: decreased strength, increased edema, impaired flexibility, and pain.   ACTIVITY LIMITATIONS: carrying, lifting,  bending, standing, stairs, toileting, and locomotion level  PARTICIPATION LIMITATIONS: cleaning, shopping, community activity, and yard work  PERSONAL FACTORS: Age, Time since onset of injury/illness/exacerbation, and 1-2 comorbidities: CAD, BPH, depression and h/o of pulmonary embolism are also affecting patient's functional outcome.   REHAB POTENTIAL: Good  CLINICAL DECISION MAKING: Stable/uncomplicated  EVALUATION COMPLEXITY: Low   GOALS: Goals reviewed with patient? No  SHORT TERM GOALS: Target date: 04/08/2024  Patient will demonstrate undestanding of home exercise plan by performing exercises correctly with evidence of good carry over with min to no verbal or tactile cues .   Baseline: NT  03/29/24: Performing exercises indepencently   Goal status: ACHIEVED     2.  Patient will demonstrate understanding of activity modifications while performing home improvement tasks to avoid exacerbating knee pain like sitting while doing certain tasks to reduce incidence of provoking pain in his right knee.  Baseline: NT 04/07/24: Able to modify activity accordingly to avoid exacerbation of his right knee pain  Goal status: ACHIEVED     LONG TERM GOALS: Target date: 06/17/2024   Patient will demonstrate an improvement in right knee function as evidenced by a decrease in his KOOS Jr score by >=14 pts.  Baseline: 15/28 (50%)   04/19/24: 3/28 () Goal status: ONGOING    2.  Patient will show an improvement in  hip and knee strength by >=1/3 grade MMT (ie 4- to 4) for improved knee stability to return to perform prolonged standing and kneeling activities without being limited by pain so he can complete home improvement projects. Baseline: Knee Ext R/L 4/4, Knee Flex R/L 4/4, Hip Flex R/L 4/4, Hip Abd R/L 4/4, Hip Ext R/L 4-/4-, Hip IR R/L 4/4, Hip ER R/L 4/4  04/19/24: Knee Ext R/L 4+/4+, Knee Flex R/L 4+/4+, Hip ER R/L 4/4, Hip IR R/L 4+/4+,  Goal status: ONGOING    3.  Patient will show improve  hip flexor flexibility with no hip raise on Ely's test for improved knee function to perform prolonged standing and kneeling activities without being limited by pain and discomfort. Baseline: + Bilateral  04/19/24: + Bilateral  Goal status: ONGOING    4.  Patient will be able to perform weight bearing activities without exceeding right knee pain >=4/10 as evidence of improved knee function and to resume home improvement tasks. Baseline: 6-7/10 in medial pole of patella on right knee  04/19/24: 1/10 NRPS    Goal status: ACHIEVED       PLAN:  PT FREQUENCY: 1-2x/week  PT DURATION: 12 weeks  PLANNED INTERVENTIONS: 97164- PT Re-evaluation, 97750- Physical Performance Testing, 97110-Therapeutic exercises, 97530- Therapeutic activity, 97112- Neuromuscular re-education, 97535- Self Care, 02859- Manual therapy, [id number]- Gait training, [phone]- Orthotic Initial, [phone]- Canalith repositioning, [id number]- Aquatic Therapy, [phone]- Electrical stimulation (unattended), [phone]- Electrical stimulation (manual), 20560 (1-2 muscles), 20561 (3+ muscles)- Dry Needling, Patient/Family education, Balance training, Stair training, Taping, Joint mobilization, Joint manipulation, Spinal manipulation, Spinal mobilization, Vestibular training, DME instructions, Cryotherapy, and Moist heat  PLAN FOR NEXT SESSION: Assess Hip Adductor strength. Progress glute and abductor strength. OMEGA HS curls, OMEGA Knee Ext, and OMEGA Leg Press    Toribio Servant PT, DPT  [facility] [address], [place], [address] Phone: [phone]   Fax:  [phone]

## 2024-04-21 ENCOUNTER — Ambulatory Visit: Admitting: Physical Therapy

## 2024-04-26 ENCOUNTER — Encounter: Admitting: Physical Therapy

## 2024-04-26 ENCOUNTER — Ambulatory Visit: Admitting: Physical Therapy

## 2024-04-26 DIAGNOSIS — M25561 Pain in right knee: Secondary | ICD-10-CM | POA: Diagnosis not present

## 2024-04-26 DIAGNOSIS — G8929 Other chronic pain: Secondary | ICD-10-CM

## 2024-04-26 NOTE — Therapy (Signed)
 OUTPATIENT PHYSICAL THERAPY LOWER EXTREMITY DISCHARGE     Patient Name: Jacob Davenport MRN: [id number] DOB:1951-02-16, 73 y.o., male Today's Date: 04/26/2024  END OF SESSION:  PT End of Session - 04/26/24 1722     Visit Number 8    Number of Visits 24    Date for PT Re-Evaluation 06/17/24    Authorization Type [employer] 2025    Authorization - Visit Number 8    Authorization - Number of Visits 24    Progress Note Due on Visit 10    PT Start Time 1650    PT Stop Time 1720    PT Time Calculation (min) 30 min    Activity Tolerance Patient tolerated treatment well    Behavior During Therapy WFL for tasks assessed/performed               Past Medical History:  Diagnosis Date   Adenomatous colon polyp    Allergic rhinitis    Alopecia    Anticoagulated on Coumadin    BPH (benign prostatic hyperplasia)    Chronic lower back pain    Coronary artery disease    Depression    Erectile dysfunction    Eye trauma    steel splinter    Genital herpes    GERD (gastroesophageal reflux disease)    Hypertension    Impaired fasting glucose    Pulmonary embolism, bilateral (HCC)    Superior mesenteric artery stenosis (HCC)    Past Surgical History:  Procedure Laterality Date   COLONOSCOPY     COLONOSCOPY N/A 03/24/2024   Procedure: COLONOSCOPY;  Surgeon: Toledo, Ladell POUR, MD;  Location: [facility];  Service: Gastroenterology;  Laterality: N/A;   COLONOSCOPY WITH PROPOFOL  N/A 05/31/2019   Procedure: COLONOSCOPY WITH PROPOFOL ;  Surgeon: Toledo, Ladell POUR, MD;  Location: [facility];  Service: Gastroenterology;  Laterality: N/A;   HEMORRHOID SURGERY     POLYPECTOMY  03/24/2024   Procedure: POLYPECTOMY, INTESTINE;  Surgeon: Toledo, Teodoro K, MD;  Location: [facility];  Service: Gastroenterology;;   There are no active problems to display for this patient.   PCP: Dr. Ike Lavender     REFERRING PROVIDER: Dr. 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 He was able to knee on right knee for several   PERTINENT HISTORY: Pt has been remodeling his home for the past several months. He started to experience pain when crawling in his attic especially in right knee and from standing for long periods of time. He mainly experiences the pain in the medial side of his right knee and he received imaging that show joint space narrowing along the medial compartment of the right knee and mainly when he is weight bearing. He has spent the past couple of weeks off of his feet to see if this makes a difference in improving his knee pain.    PAIN:  Are you having pain? Yes: NPRS scale: 1-2/10 NRPS at worst    Pain location: Medial side of patella on right knee    Pain description: Sharp  Aggravating factors: Weight bearing especially standing for long periods of time or kneeling  on his right knee  Relieving factors: Non-weight bearing like sitting and resting      PRECAUTIONS: None  RED FLAGS: None   WEIGHT BEARING RESTRICTIONS: No  FALLS:  Has patient fallen in last 6 months? No  LIVING ENVIRONMENT: Lives with: lives with their spouse Lives in: House/apartment Stairs: Yes: Internal: 13 steps; on right going up and External: 2 steps; none Has following equipment at home: Single point cane and Walker - 2 wheeled  OCCUPATION: Retired     PLOF: Independent  PATIENT GOALS: Pt wants to know what to do in order to get the knee to heal and wants to know what exercise to do in order to avoid injury.   NEXT MD VISIT: 06/2024     OBJECTIVE:  Note: Objective measures were completed at Evaluation  unless otherwise noted.  VITALS BP 124/73 HR 79 Sp02 99%  DIAGNOSTIC FINDINGS: [place]    PATIENT SURVEYS:  KOOS JR: 15/28 (50%)  COGNITION: Overall cognitive status: Within functional limits for tasks assessed     SENSATION: WFL  EDEMA:  Circumferential: R/L 15/14 3/4  MUSCLE LENGTH: Hamstrings: Right 90 deg; Left 90 deg Thomas test: NT  Ely's: + Bilaterally   POSTURE: rounded shoulders  PALPATION: TTP medial border of patella on right knee    LOWER EXTREMITY ROM:  Active ROM Right eval Left eval  Hip flexion    Hip extension    Hip abduction    Hip adduction    Hip internal rotation    Hip external rotation    Knee flexion 130 130  Knee extension 0 0  Ankle dorsiflexion    Ankle plantarflexion    Ankle inversion    Ankle eversion     (Blank rows = not tested)    LOWER EXTREMITY MMT:  MMT Right eval Left eval Right  04/19/24   Left  04/19/24  Hip flexion 4 4 4+ 4+  Hip extension 4- 4- 4 4+  Hip abduction 4 4 4+ 4+  Hip adduction 4- 4- 4 4  Hip internal rotation 4 4 4+ 4+  Hip external rotation 4  4  4 4   Knee flexion 4 4 4+ 4+  Knee extension 4 4 4+ 4+  Ankle dorsiflexion 4+ 4+    Ankle plantarflexion      Ankle inversion      Ankle eversion       (Blank rows = not tested)         LOWER EXTREMITY SPECIAL TESTS:  Knee special tests: Patellafemoral apprehension test: NT, Lateral pull sign: NT, Patellafemoral grind test: NT, and Step up/down test: NT   FUNCTIONAL TESTS:  Squats: No knee varus and valgus and able to reach 90 deg hip and knee flexion   Step Over: Able to step over 6 inch step onto RLE and down with LLE; no knee varus or valgus   GAIT: Not performed  TREATMENT DATE:   04/26/24: THERAC    Squats with banded knees for hip ER green band while hold water jug with flexed shoulder and extended  elbow  2 x 10   Banded Side Step with  Blue band at knees 10 ft x 2    Banded Side Step with Blue band at ankles 10 ft  x 2   -min VC for patient to maintain squat to avoid compensating   Hip Adduction MMT  R/L 4/4    SELF CARE HOME MANAGEMENT  Education about home exercise plan and how to progress exercises using 10 reps as rate of perceived exertion and if able to reach 10 reps and perform more than increase resistance.      PATIENT EDUCATION:  Education details: Form and technique for correct performance of exercise and explanation about underlying deficits.  Person educated: Patient Education method: Explanation, Demonstration, Verbal cues, and Handouts Education comprehension: verbalized understanding, returned demonstration, and verbal cues required  HOME EXERCISE PROGRAM: Access Code: [email or web address] URL: [email or web address] Date: 04/26/2024 Prepared by: Toribio Servant  Exercises - Prone Quadriceps Stretch with Strap  - 1 x daily - 7 x weekly - 2-3 reps - 60 sec  hold - Seated Table Hamstring Stretch  - 1 x daily - 7 x weekly - 3 reps - 60 sec hold - Squat with Chair Touch and Resistance Loop  - 3-4 x weekly - 3 sets - 10 reps - Side Stepping with Resistance at Thighs  - 3-4 x weekly - 3 sets - 10 reps - Sidelying Hip Adduction  - 3-4 x weekly - 3 sets - 10 reps - Sidelying Hip Adduction (Mirrored)  - 3-4 x weekly - 3 sets - 10 reps - Standing Bilateral Heel Raise on Step  - 3-4 x weekly - 3 sets - 15 reps  ASSESSMENT:  CLINICAL IMPRESSION: Pt has now reach all of his rehab goals with improvement in hip strength. He is now ready for discharge and he has been given a home exercise plan to continue to maintain decreased right knee pain and improved right knee function.   OBJECTIVE IMPAIRMENTS: decreased strength, increased edema, impaired flexibility, and pain.   ACTIVITY LIMITATIONS: carrying, lifting, bending, standing, stairs, toileting, and locomotion  level  PARTICIPATION LIMITATIONS: cleaning, shopping, community activity, and yard work  PERSONAL FACTORS: Age, Time since onset of injury/illness/exacerbation, and 1-2 comorbidities: CAD, BPH, depression and h/o of pulmonary embolism are also affecting patient's functional outcome.   REHAB POTENTIAL: Good  CLINICAL DECISION MAKING: Stable/uncomplicated  EVALUATION COMPLEXITY: Low   GOALS: Goals reviewed with patient? No  SHORT TERM GOALS: Target date: 04/08/2024  Patient will demonstrate undestanding of home exercise plan by performing exercises correctly with evidence of good carry over with min to no verbal or tactile cues .   Baseline: NT  03/29/24: Performing exercises indepencently   Goal status: ACHIEVED     2.  Patient will demonstrate understanding of activity modifications while performing home improvement tasks to avoid exacerbating knee pain like sitting while doing certain tasks to reduce incidence of provoking pain in his right knee.  Baseline: NT 04/07/24: Able to modify activity accordingly to avoid exacerbation of his right knee pain  Goal status: ACHIEVED     LONG TERM GOALS: Target date: 06/17/2024   Patient will demonstrate an improvement in right knee function as evidenced by a decrease in his KOOS Jr score by >=14 pts.  Baseline: 15/28 (50%)   04/19/24: 3/28 (80%) Goal status:  ACHIEVED    2.  Patient will show an improvement in hip and knee strength by >=1/3 grade MMT (ie 4- to 4) for improved knee stability to return to perform prolonged standing and kneeling activities without being limited by pain so he can complete home improvement projects. Baseline: Knee Ext R/L 4/4, Knee Flex R/L 4/4, Hip Flex R/L 4/4, Hip Abd R/L 4/4, Hip Ext R/L 4-/4-, Hip IR R/L 4/4, Hip ER R/L 4/4  04/19/24: Knee Ext R/L 4+/4+, Knee Flex R/L 4+/4+, Hip ER R/L 4/4, Hip IR R/L 4+/4+, Hip ADD R/L 4/4   Goal status: ACHIEVED    3.  Patient will show improve hip flexor flexibility with no  hip raise on Ely's test for improved knee function to perform prolonged standing and kneeling activities without being limited by pain and discomfort. Baseline: + Bilateral  04/19/24: + Bilateral  Goal status: ONGOING    4.  Patient will be able to perform weight bearing activities without exceeding right knee pain >=4/10 as evidence of improved knee function and to resume home improvement tasks. Baseline: 6-7/10 in medial pole of patella on right knee  04/19/24: 1/10 NRPS    Goal status: ACHIEVED       PLAN:  PT FREQUENCY: 1-2x/week  PT DURATION: 12 weeks  PLANNED INTERVENTIONS: 97164- PT Re-evaluation, 97750- Physical Performance Testing, 97110-Therapeutic exercises, 97530- Therapeutic activity, 97112- Neuromuscular re-education, 97535- Self Care, 02859- Manual therapy, [phone]- Gait training, [phone]- Orthotic Initial, [phone]- Canalith repositioning, [id number]- Aquatic Therapy, [phone]- Electrical stimulation (unattended), [phone]- Electrical stimulation (manual), [phone] (1-2 muscles), 20561 (3+ muscles)- Dry Needling, Patient/Family education, Balance training, Stair training, Taping, Joint mobilization, Joint manipulation, Spinal manipulation, Spinal mobilization, Vestibular training, DME instructions, Cryotherapy, and Moist heat  PLAN FOR NEXT SESSION: Discharge from PT   Toribio Servant PT, DPT  [facility] [address] [place], [address] Phone: [phone]   Fax:  [phone]

## 2024-04-29 ENCOUNTER — Ambulatory Visit: Admitting: Physical Therapy

## 2024-05-03 ENCOUNTER — Ambulatory Visit: Admitting: Physical Therapy

## 2024-05-05 ENCOUNTER — Encounter: Admitting: Physical Therapy

## 2024-05-10 ENCOUNTER — Encounter: Admitting: Physical Therapy

## 2024-05-12 ENCOUNTER — Encounter: Admitting: Physical Therapy

## 2024-05-17 ENCOUNTER — Encounter: Admitting: Physical Therapy

## 2024-05-19 ENCOUNTER — Encounter: Admitting: Physical Therapy

## 2024-05-24 ENCOUNTER — Encounter: Admitting: Physical Therapy

## 2024-05-27 ENCOUNTER — Encounter: Admitting: Physical Therapy
